# Patient Record
Sex: Female | Born: 1975 | Race: Black or African American | Hispanic: No | Marital: Single | Smoking: Current every day smoker
Health system: Southern US, Community
[De-identification: ages and names within clinical notes are randomized; demographics above are authoritative.]

## PROBLEM LIST (undated history)

## (undated) DIAGNOSIS — I10 Essential (primary) hypertension: Secondary | ICD-10-CM

## (undated) DIAGNOSIS — J329 Chronic sinusitis, unspecified: Secondary | ICD-10-CM

## (undated) DIAGNOSIS — J302 Other seasonal allergic rhinitis: Secondary | ICD-10-CM

## (undated) DIAGNOSIS — J40 Bronchitis, not specified as acute or chronic: Secondary | ICD-10-CM

## (undated) HISTORY — PX: TUBAL LIGATION: SHX77

---

## 1999-02-05 ENCOUNTER — Inpatient Hospital Stay (HOSPITAL_COMMUNITY): Admission: AD | Admit: 1999-02-05 | Discharge: 1999-02-05 | Payer: Self-pay | Admitting: Obstetrics & Gynecology

## 1999-07-03 ENCOUNTER — Inpatient Hospital Stay (HOSPITAL_COMMUNITY): Admission: AD | Admit: 1999-07-03 | Discharge: 1999-07-03 | Payer: Self-pay | Admitting: Obstetrics

## 1999-09-08 ENCOUNTER — Emergency Department (HOSPITAL_COMMUNITY): Admission: EM | Admit: 1999-09-08 | Discharge: 1999-09-08 | Payer: Self-pay | Admitting: Emergency Medicine

## 1999-09-24 ENCOUNTER — Inpatient Hospital Stay (HOSPITAL_COMMUNITY): Admission: AD | Admit: 1999-09-24 | Discharge: 1999-09-24 | Payer: Self-pay | Admitting: Obstetrics & Gynecology

## 1999-10-01 ENCOUNTER — Ambulatory Visit (HOSPITAL_COMMUNITY): Admission: RE | Admit: 1999-10-01 | Discharge: 1999-10-01 | Payer: Self-pay | Admitting: Family Medicine

## 1999-10-22 ENCOUNTER — Inpatient Hospital Stay (HOSPITAL_COMMUNITY): Admission: AD | Admit: 1999-10-22 | Discharge: 1999-10-22 | Payer: Self-pay | Admitting: Obstetrics & Gynecology

## 1999-11-02 ENCOUNTER — Inpatient Hospital Stay (HOSPITAL_COMMUNITY): Admission: AD | Admit: 1999-11-02 | Discharge: 1999-11-02 | Payer: Self-pay | Admitting: Obstetrics

## 1999-11-15 ENCOUNTER — Inpatient Hospital Stay (HOSPITAL_COMMUNITY): Admission: AD | Admit: 1999-11-15 | Discharge: 1999-11-15 | Payer: Self-pay | Admitting: Obstetrics & Gynecology

## 1999-11-16 ENCOUNTER — Inpatient Hospital Stay (HOSPITAL_COMMUNITY): Admission: AD | Admit: 1999-11-16 | Discharge: 1999-11-16 | Payer: Self-pay | Admitting: *Deleted

## 1999-11-22 ENCOUNTER — Observation Stay (HOSPITAL_COMMUNITY): Admission: AD | Admit: 1999-11-22 | Discharge: 1999-11-23 | Payer: Self-pay | Admitting: Obstetrics

## 1999-12-02 ENCOUNTER — Encounter: Admission: RE | Admit: 1999-12-02 | Discharge: 1999-12-02 | Payer: Self-pay | Admitting: Obstetrics & Gynecology

## 1999-12-16 ENCOUNTER — Ambulatory Visit (HOSPITAL_COMMUNITY): Admission: RE | Admit: 1999-12-16 | Discharge: 1999-12-16 | Payer: Self-pay | Admitting: Obstetrics & Gynecology

## 1999-12-16 ENCOUNTER — Encounter: Admission: RE | Admit: 1999-12-16 | Discharge: 1999-12-16 | Payer: Self-pay | Admitting: Obstetrics & Gynecology

## 1999-12-17 ENCOUNTER — Ambulatory Visit (HOSPITAL_COMMUNITY): Admission: RE | Admit: 1999-12-17 | Discharge: 1999-12-17 | Payer: Self-pay | Admitting: Obstetrics & Gynecology

## 1999-12-23 ENCOUNTER — Encounter: Admission: RE | Admit: 1999-12-23 | Discharge: 1999-12-23 | Payer: Self-pay | Admitting: Obstetrics & Gynecology

## 1999-12-28 ENCOUNTER — Inpatient Hospital Stay (HOSPITAL_COMMUNITY): Admission: AD | Admit: 1999-12-28 | Discharge: 1999-12-28 | Payer: Self-pay | Admitting: Obstetrics

## 2000-01-13 ENCOUNTER — Inpatient Hospital Stay (HOSPITAL_COMMUNITY): Admission: AD | Admit: 2000-01-13 | Discharge: 2000-01-13 | Payer: Self-pay | Admitting: Obstetrics

## 2000-01-20 ENCOUNTER — Encounter: Admission: RE | Admit: 2000-01-20 | Discharge: 2000-01-20 | Payer: Self-pay | Admitting: Obstetrics & Gynecology

## 2000-01-27 ENCOUNTER — Encounter (HOSPITAL_COMMUNITY): Admission: RE | Admit: 2000-01-27 | Discharge: 2000-02-09 | Payer: Self-pay | Admitting: Obstetrics & Gynecology

## 2000-01-27 ENCOUNTER — Encounter: Admission: RE | Admit: 2000-01-27 | Discharge: 2000-01-27 | Payer: Self-pay | Admitting: Obstetrics & Gynecology

## 2000-01-29 ENCOUNTER — Inpatient Hospital Stay (HOSPITAL_COMMUNITY): Admission: AD | Admit: 2000-01-29 | Discharge: 2000-01-29 | Payer: Self-pay | Admitting: Obstetrics & Gynecology

## 2000-02-01 ENCOUNTER — Encounter (HOSPITAL_COMMUNITY): Admission: AD | Admit: 2000-02-01 | Discharge: 2000-02-09 | Payer: Self-pay | Admitting: Obstetrics & Gynecology

## 2000-02-03 ENCOUNTER — Encounter: Admission: RE | Admit: 2000-02-03 | Discharge: 2000-02-03 | Payer: Self-pay | Admitting: Obstetrics & Gynecology

## 2000-02-08 ENCOUNTER — Inpatient Hospital Stay (HOSPITAL_COMMUNITY): Admission: AD | Admit: 2000-02-08 | Discharge: 2000-02-10 | Payer: Self-pay | Admitting: Obstetrics

## 2000-09-03 ENCOUNTER — Emergency Department (HOSPITAL_COMMUNITY): Admission: EM | Admit: 2000-09-03 | Discharge: 2000-09-03 | Payer: Self-pay | Admitting: Emergency Medicine

## 2001-02-12 ENCOUNTER — Emergency Department (HOSPITAL_COMMUNITY): Admission: EM | Admit: 2001-02-12 | Discharge: 2001-02-12 | Payer: Self-pay | Admitting: Emergency Medicine

## 2001-02-12 ENCOUNTER — Encounter: Payer: Self-pay | Admitting: Emergency Medicine

## 2001-05-07 ENCOUNTER — Emergency Department (HOSPITAL_COMMUNITY): Admission: EM | Admit: 2001-05-07 | Discharge: 2001-05-07 | Payer: Self-pay | Admitting: Emergency Medicine

## 2001-05-07 ENCOUNTER — Encounter: Payer: Self-pay | Admitting: Emergency Medicine

## 2001-11-16 ENCOUNTER — Encounter: Admission: RE | Admit: 2001-11-16 | Discharge: 2001-11-16 | Payer: Self-pay | Admitting: Specialist

## 2001-11-16 ENCOUNTER — Encounter: Payer: Self-pay | Admitting: Specialist

## 2001-12-19 ENCOUNTER — Emergency Department (HOSPITAL_COMMUNITY): Admission: EM | Admit: 2001-12-19 | Discharge: 2001-12-19 | Payer: Self-pay | Admitting: Emergency Medicine

## 2001-12-19 ENCOUNTER — Encounter: Payer: Self-pay | Admitting: Emergency Medicine

## 2002-03-06 ENCOUNTER — Inpatient Hospital Stay: Admission: AD | Admit: 2002-03-06 | Discharge: 2002-03-06 | Payer: Self-pay | Admitting: *Deleted

## 2002-04-05 ENCOUNTER — Encounter: Admission: RE | Admit: 2002-04-05 | Discharge: 2002-04-05 | Payer: Self-pay | Admitting: *Deleted

## 2002-04-12 ENCOUNTER — Ambulatory Visit (HOSPITAL_COMMUNITY): Admission: RE | Admit: 2002-04-12 | Discharge: 2002-04-12 | Payer: Self-pay

## 2002-05-03 ENCOUNTER — Encounter: Admission: RE | Admit: 2002-05-03 | Discharge: 2002-05-03 | Payer: Self-pay | Admitting: *Deleted

## 2002-05-12 ENCOUNTER — Inpatient Hospital Stay (HOSPITAL_COMMUNITY): Admission: AD | Admit: 2002-05-12 | Discharge: 2002-05-12 | Payer: Self-pay | Admitting: *Deleted

## 2002-07-02 ENCOUNTER — Inpatient Hospital Stay (HOSPITAL_COMMUNITY): Admission: AD | Admit: 2002-07-02 | Discharge: 2002-07-02 | Payer: Self-pay | Admitting: *Deleted

## 2002-07-11 ENCOUNTER — Encounter: Admission: RE | Admit: 2002-07-11 | Discharge: 2002-07-11 | Payer: Self-pay | Admitting: *Deleted

## 2002-07-31 ENCOUNTER — Ambulatory Visit (HOSPITAL_COMMUNITY): Admission: RE | Admit: 2002-07-31 | Discharge: 2002-07-31 | Payer: Self-pay | Admitting: *Deleted

## 2002-08-01 ENCOUNTER — Encounter: Admission: RE | Admit: 2002-08-01 | Discharge: 2002-08-01 | Payer: Self-pay | Admitting: *Deleted

## 2002-08-08 ENCOUNTER — Encounter: Admission: RE | Admit: 2002-08-08 | Discharge: 2002-08-08 | Payer: Self-pay | Admitting: *Deleted

## 2002-08-22 ENCOUNTER — Encounter: Payer: Self-pay | Admitting: *Deleted

## 2002-08-22 ENCOUNTER — Inpatient Hospital Stay (HOSPITAL_COMMUNITY): Admission: AD | Admit: 2002-08-22 | Discharge: 2002-08-25 | Payer: Self-pay | Admitting: Family Medicine

## 2002-08-24 ENCOUNTER — Encounter: Payer: Self-pay | Admitting: *Deleted

## 2002-09-12 ENCOUNTER — Inpatient Hospital Stay (HOSPITAL_COMMUNITY): Admission: AD | Admit: 2002-09-12 | Discharge: 2002-09-12 | Payer: Self-pay | Admitting: Obstetrics and Gynecology

## 2002-09-24 ENCOUNTER — Inpatient Hospital Stay (HOSPITAL_COMMUNITY): Admission: AD | Admit: 2002-09-24 | Discharge: 2002-09-24 | Payer: Self-pay | Admitting: *Deleted

## 2002-09-30 ENCOUNTER — Inpatient Hospital Stay (HOSPITAL_COMMUNITY): Admission: AD | Admit: 2002-09-30 | Discharge: 2002-09-30 | Payer: Self-pay | Admitting: *Deleted

## 2002-10-12 ENCOUNTER — Encounter (INDEPENDENT_AMBULATORY_CARE_PROVIDER_SITE_OTHER): Payer: Self-pay | Admitting: *Deleted

## 2002-10-12 ENCOUNTER — Inpatient Hospital Stay (HOSPITAL_COMMUNITY): Admission: AD | Admit: 2002-10-12 | Discharge: 2002-10-14 | Payer: Self-pay | Admitting: Obstetrics and Gynecology

## 2004-04-18 ENCOUNTER — Emergency Department (HOSPITAL_COMMUNITY): Admission: EM | Admit: 2004-04-18 | Discharge: 2004-04-18 | Payer: Self-pay | Admitting: Emergency Medicine

## 2004-05-03 ENCOUNTER — Ambulatory Visit (HOSPITAL_COMMUNITY): Admission: RE | Admit: 2004-05-03 | Discharge: 2004-05-03 | Payer: Self-pay | Admitting: Orthopaedic Surgery

## 2005-03-12 ENCOUNTER — Emergency Department (HOSPITAL_COMMUNITY): Admission: EM | Admit: 2005-03-12 | Discharge: 2005-03-12 | Payer: Self-pay | Admitting: Emergency Medicine

## 2006-07-14 ENCOUNTER — Emergency Department (HOSPITAL_COMMUNITY): Admission: EM | Admit: 2006-07-14 | Discharge: 2006-07-14 | Payer: Self-pay | Admitting: Emergency Medicine

## 2007-02-14 ENCOUNTER — Emergency Department (HOSPITAL_COMMUNITY): Admission: EM | Admit: 2007-02-14 | Discharge: 2007-02-15 | Payer: Self-pay | Admitting: *Deleted

## 2007-04-22 ENCOUNTER — Emergency Department (HOSPITAL_COMMUNITY): Admission: EM | Admit: 2007-04-22 | Discharge: 2007-04-22 | Payer: Self-pay | Admitting: Emergency Medicine

## 2008-02-19 ENCOUNTER — Emergency Department (HOSPITAL_COMMUNITY): Admission: EM | Admit: 2008-02-19 | Discharge: 2008-02-19 | Payer: Self-pay | Admitting: Emergency Medicine

## 2009-09-16 ENCOUNTER — Emergency Department (HOSPITAL_COMMUNITY): Admission: EM | Admit: 2009-09-16 | Discharge: 2009-09-16 | Payer: Self-pay | Admitting: Family Medicine

## 2010-11-01 ENCOUNTER — Encounter: Payer: Self-pay | Admitting: Orthopaedic Surgery

## 2011-02-26 NOTE — Op Note (Signed)
NAME:  Mary Decker, Mary Decker                          ACCOUNT NO.:  [id number]   MEDICAL RECORD NO.:  [id number]                   PATIENT TYPE:  INP   LOCATION:  9137                                 FACILITY:  [facility]   PHYSICIAN:  Tanya S. Shawnie Pons, M.D.                DATE OF BIRTH:  1976/05/19   DATE OF PROCEDURE:  10/12/2001  DATE OF DISCHARGE:                                 OPERATIVE REPORT   PREOPERATIVE DIAGNOSIS:  Multiparity with undesired fertility.   POSTOPERATIVE DIAGNOSIS:  Multiparity with undesired fertility.   PROCEDURE:  Postpartum BTL.   SURGEON:  Shelbie Proctor. Shawnie Pons, M.D.   ANESTHESIA:  Epidural.   ESTIMATED BLOOD LOSS:  Less than 25 cc.   SPECIMENS:  Tubes to pathology.   COMPLICATIONS:  None.   REASON FOR PROCEDURE:  Briefly, the patient is an 35 year old gravida 5 para  4 now who is postpartum day #0 from a spontaneous vaginal delivery who  desires permanent sterility.   DESCRIPTION OF PROCEDURE:  The patient is taken to the operating room where  epidural anesthesia is administered.  She was also given some IV sedation  when her anesthesia was shown to be adequate.  She was prepped and draped in  the usual sterile fashion.  Marcaine 0.25% plain 5 cc was then injected  infraumbilically.  The skin was then grasped with 2 Allis clamps and I then  made a 2 cm skin incision.  This incision was carried down to the underlying  fascia and the peritoneum which was entered sharply.  An [employer] retractor  was then inserted into the peritoneum.  The patient was placed in  Trendelenburg.  The left tube was identified and grasped with a Babcock  clamp, followed to its fimbriated end.  A 1 cm nuchal tube was brought up to  approximately 2 cm from the cornea of the uterus and 0 plain tie ligatures  placed about this tube x2.  The tubes were removed with Metzenbaum scissors  and tube was checked for hemostasis, and hemostasis was found to be  adequate.  The tube was returned to  the abdominal cavity.  Similarly, the  right tube was identified and brought to the incision with a Babcock clamp,  followed to its fimbriated end; 2 cm from the cornea a 1 cm nuchal tube was  then ligated with two 0 plain ligatures.  The tube was then excised and sent  to pathology.  When hemostasis was found to be adequate the tube was  returned to the peritoneal cavity.  The edges of the fascia were then  grasped with an Allis cramp and the fascia closed with a #1 Vicryl suture in  a running fashion.  The skin was then closed with a 4-0 Vicryl in a running  subcuticular fashion.  The patient's abdomen was then cleaned and sterile  dressing was applied.  All instrument, needle, and  lap counts were correct  x2.  The patient was awakened and taken to recovery room in stable  condition.                                               Shelbie Proctor. Shawnie Pons, M.D.   TSP/MEDQ  D:  10/12/2002  T:  10/13/2002  Job:  [id number]

## 2011-02-26 NOTE — Discharge Summary (Signed)
NAME:  Mary Decker, Mary Decker                          ACCOUNT NO.:  [id number]   MEDICAL RECORD NO.:  [id number]                   PATIENT TYPE:  INP   LOCATION:  9137                                 [facility]ACILITY:  [facility]   PHYSICIAN:  Phil D. Okey Dupre, M.D.                  DATE OF BIRTH:  1976-07-18   DATE OF ADMISSION:  10/12/2002  DATE OF DISCHARGE:  10/14/2002                                 DISCHARGE SUMMARY   ADMISSION DIAGNOSES:  1. Term intrauterine pregnancy in active labor.  2. Multiparity.  3. Chronic hypertension.   DISCHARGE DIAGNOSES:  1. Spontaneous vaginal delivery of viable female, Apgars 9 at one minute and     9 at five minutes.  2. Bilateral tubal ligation.  3. Chronic hypertension.   HISTORY OF PRESENT ILLNESS:  This is a 35 year old gravida 5, para 3-0-1-3,  who presented at 39-4/7 weeks well-dated, in active labor.  She was brought  in by EMS reporting having had contractions with intact membranes, some  vaginal show, and good fetal activity.  Her ROS was negative.  Her prenatal  course was at [facility] with onset of care at 11 weeks.  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  She was on Aldomet low dose 150 t.i.d. for her chronic  hypertension.  She is also on prenatal vitamin one a day. She had no  allergies.   PAST OBSTETRICAL HISTORY:  She had uncomplicated term vaginal deliveries x3.  GYN history negative STI's or abnormal Paps.   PAST MEDICAL HISTORY:  Hypertension.  Surgeries; none.   FAMILY HISTORY:  Noncontributory.   SOCIAL HISTORY:  She was a smoker, smoking about four to five cigarettes a  day.   PRENATAL LABORATORY DATA:  O positive, antibody screen negative, hemoglobin  12.9, rubella immune, hepatitis negative, syphilis negative, HIV negative,  GC negative, Chlamydia negative, GBS negative. One-hour GTT 76.   PHYSICAL EXAMINATION:  VITAL SIGNS: Afebrile, vital signs  significant for  blood pressure 147/112.  GENERAL:  She was in no distress.  HEART:  Regular rate and rhythm without murmur.  LUNGS:  Clear to auscultation.  ABDOMEN:  Term size gravid.  EXTREMITIES: Without edema.  NEUROLOGY:  Nonfocal.  PELVIC: Her digital cervical examination on admission was 5 cm.  Fetal heart  rate was 140, reactive with average variability without decelerations.  Her  contractions were approximately 3 to 5 minutes apart and moderately strong.   HOSPITAL COURSE:  The decision was made to admit for active labor at term  with routine expectant management.  She did receive an epidural and  progressed well in active labor. She had meconium stained fluid on  artificial rupture of membranes.  She progressed to delivery of a viable  female spontaneously. She had DeLee suction with spontaneous cry. NICU team  was present with no lacerations. Apgars were 9 at one minute and 9at five  minutes.  Subsequently, she went to the operating room for her bilateral  tubal ligation by Dr. Shawnie Pons. Pathology revealed segments of right and left  fallopian tube.  Postoperatively, she had some mild wound tenderness.  She  remained afebrile, blood pressure 120/98.  Percocet was controlling her pain  and on postpartum day #2 and postoperative day #2 from her tubal she was  doing well, bottle feeding, ambulatory without orthostatic symptoms.  Lochia  was tapering.  Her blood pressure was 132/90 on discharge day. Her PIH labs  had been well within normal limits. Her hemoglobin was 8.9, hematocrit 24.5.  Examination was normal with a well-contracted fundus.  The need to have  regular medical care and follow-up on her blood pressures was discussed with  the patient. She was to have a home health postpartum visit in two days and  a six-[facility] checkup at [facility].  She was planning to follow up at  [facility] or at [facility] in about three months to assess  the need for an  antihypertensive as she was not sent home on one.   DISCHARGE MEDICATIONS:  1. Iron one p.o. b.i.d.  2. Ibuprofen 600 mg one p.o. q.6h.  3. Percocet 325 mg one p.o. q.6h. p.r.n. pain.   CONDITION ON DISCHARGE:  Good.     Deirdre Christy Gentles, C.N.M.                       Phil D. Okey Dupre, M.D.    DP/MEDQ  D:  01/21/2003  T:  01/21/2003  Job:  [id number]

## 2011-02-26 NOTE — Discharge Summary (Signed)
NAME:  Mary Decker, Mary Decker                          ACCOUNT NO.:  [id number]   MEDICAL RECORD NO.:  [id number]                   PATIENT TYPE:  INP   LOCATION:  9160                                 [facility]   PHYSICIAN:  Tanya S. Shawnie Pons, M.D.                DATE OF BIRTH:  Apr 20, 1976   DATE OF ADMISSION:  08/22/2002  DATE OF DISCHARGE:  08/25/2002                                 DISCHARGE SUMMARY   FINAL DIAGNOSES:  1. Intrauterine pregnancy at 32-5/7 weeks.  2. Question of prolonged premature rupture of membranes ruled out.  3. Chronic hypertension.   REASON FOR ADMISSION:  Briefly, please see full H&P on the chart, the  patient is a 35 year old gravida 5, para 3-0-1-3, who came in at 32-1/2  weeks on the day of admission with back pain.  She then proceeded to  rupture in the MAU, which according to the nurses was fern and nitrazine-  positive.  She alsos had a foul odor which appeared to be fern-positive.  Given her early gestation, the patient was given a course of betamethasone,  started on antibiotics, and managed on continuous fetal monitoring.   HOSPITAL COURSE:  The patient had then continuous fetal monitoring for the  next two days.  She completed a course of betamethasone and was continued on  Unasyn.  She reported increasing contractions and a sterile speculum exam  was done and the rupture could not be confirmed, at which point there was a  question of whether this was truly a rupture.  On admission the patient had  an ultrasound which showed an AFI of 11.0 and an estimated fetal weight of  2005 g, and the baby was found to be vertex.  On admission her blood  pressure was markedly elevated at 150s/100s, and this came down to the  130s/60s-70s over the next several days.  She had a complete set of PIH  labs, which were within normal limits, including a 24-hour urine that had  absolutely no protein.  On hospital day #2 a repeat of her AFI was done and  revealed her AFI  to be 16.  At that point the ruptured membranes were felt  to probably not be true, so the patient was ambulated to see if she leaked.  She did leak and this was nitrazine-positive but fern-negative.  Sterile  speculum was repeated again.  It was negative pooling, negative ferning,  negative nitrazine, and then finally her urine was nitrazined and was found  to be positive, and it was felt that the patient was leaking urine and not  amniotic fluid.  So given all this and her normal labs, it was felt the  patient was ready for discharge.   LABORATORY DATA:  Her initial hemoglobin was 10, her platelet count was 274,  her creatinine was 0.6, her uric acid was 2.8, her LFTs were within normal  limit.  She had a urine drug screen that was also within normal limits.  She  had a 24-hour urine which showed less than 3 mg of protein with a good  creatinine clearance that was high, as was appropriate.  She also had a  urine culture, which was negative.  RPR was nonreactive.  Group B strep,  which was still pending.  GC and Chlamydia, which were both negative.   DISPOSITION:  She was discharged home with labor precautions and to return  with increasing pain, contractions, bleeding, or general concern over  leakage of fluid.    FOLLOW-UP:  She is to follow up in high-risk clinic next week.   DISCHARGE INSTRUCTIONS:  She has no activity or dietary restrictions.                                               Shelbie Proctor. Shawnie Pons, M.D.    TSP/MEDQ  D:  08/25/2002  T:  08/25/2002  Job:  [id number]

## 2011-04-28 ENCOUNTER — Inpatient Hospital Stay (INDEPENDENT_AMBULATORY_CARE_PROVIDER_SITE_OTHER)
Admission: RE | Admit: 2011-04-28 | Discharge: 2011-04-28 | Disposition: A | Discharge status: Home / Self Care | Payer: Medicaid Other | Source: Ambulatory Visit | Attending: Emergency Medicine | Admitting: Emergency Medicine

## 2011-04-28 DIAGNOSIS — K089 Disorder of teeth and supporting structures, unspecified: Secondary | ICD-10-CM

## 2011-04-28 DIAGNOSIS — I10 Essential (primary) hypertension: Secondary | ICD-10-CM

## 2011-04-28 LAB — POCT I-STAT, CHEM 8
BUN: 6 mg/dL (ref 6–23)
Hemoglobin: 16.3 g/dL — ABNORMAL HIGH (ref 12.0–15.0)
Sodium: 140 mEq/L (ref 135–145)

## 2011-07-27 LAB — BASIC METABOLIC PANEL
CO2: 21
GFR calc Af Amer: >60
Glucose, Bld: 96
Potassium: 3.5

## 2011-07-27 LAB — SALICYLATE LEVEL: Salicylate Lvl: <4

## 2011-07-27 LAB — RAPID URINE DRUG SCREEN, HOSP PERFORMED
Amphetamines: NOT DETECTED
Barbiturates: NOT DETECTED
Benzodiazepines: NOT DETECTED
Cocaine: NOT DETECTED
Opiates: NOT DETECTED
Tetrahydrocannabinol: POSITIVE — AB

## 2011-07-27 LAB — HEPATIC FUNCTION PANEL
ALT: 13
Alkaline Phosphatase: 54
Total Bilirubin: 0.2 — ABNORMAL LOW
Total Protein: 7.3

## 2011-07-27 LAB — DIFFERENTIAL
Basophils Absolute: 0
Basophils Relative: 0
Lymphs Abs: 1.6
Monocytes Relative: 8
Neutro Abs: 4.7

## 2011-07-27 LAB — CBC
MCV: 92.2
RDW: 13.6

## 2015-05-25 ENCOUNTER — Emergency Department (HOSPITAL_COMMUNITY)
Admission: EM | Admit: 2015-05-25 | Discharge: 2015-05-25 | Disposition: A | Discharge status: Home / Self Care | Payer: Medicaid Other | Attending: Emergency Medicine | Admitting: Emergency Medicine

## 2015-05-25 ENCOUNTER — Encounter (HOSPITAL_COMMUNITY): Payer: Self-pay | Admitting: *Deleted

## 2015-05-25 DIAGNOSIS — I10 Essential (primary) hypertension: Secondary | ICD-10-CM | POA: Insufficient documentation

## 2015-05-25 DIAGNOSIS — L03317 Cellulitis of buttock: Secondary | ICD-10-CM | POA: Insufficient documentation

## 2015-05-25 DIAGNOSIS — Z720 Tobacco use: Secondary | ICD-10-CM | POA: Insufficient documentation

## 2015-05-25 DIAGNOSIS — L0231 Cutaneous abscess of buttock: Secondary | ICD-10-CM

## 2015-05-25 HISTORY — DX: Essential (primary) hypertension: I10

## 2015-05-25 MED ORDER — CLINDAMYCIN HCL 150 MG PO CAPS: 300.0000 mg | ORAL_CAPSULE | Freq: Three times a day (TID) | ORAL | Status: DC

## 2015-05-25 MED ORDER — CLINDAMYCIN HCL 300 MG PO CAPS
300.0000 mg | ORAL_CAPSULE | Freq: Once | ORAL | Status: AC
  Administered 2015-05-25: 300 mg via ORAL
  Filled 2015-05-25: qty 2
  Filled 2015-05-25: qty 1

## 2015-05-25 MED ORDER — OXYCODONE-ACETAMINOPHEN 5-325 MG PO TABS: 1.0000 | ORAL_TABLET | ORAL | Status: DC | PRN

## 2015-05-25 MED ORDER — OXYCODONE-ACETAMINOPHEN 5-325 MG PO TABS
2.0000 | ORAL_TABLET | Freq: Once | ORAL | Status: AC
  Administered 2015-05-25: 2 via ORAL
  Filled 2015-05-25: qty 2

## 2015-05-25 MED ORDER — LIDOCAINE HCL (PF) 1 % IJ SOLN
10.0000 mL | Freq: Once | INTRAMUSCULAR | Status: AC
  Administered 2015-05-25: 10 mL via INTRADERMAL
  Filled 2015-05-25: qty 10

## 2015-05-25 NOTE — ED Provider Notes (Signed)
CSN: [id number]     Arrival date & time 05/25/15  1502 History   First MD Initiated Contact with Patient 05/25/15 1644     Chief Complaint  Patient presents with  . Recurrent Skin Infections     (Consider location/radiation/quality/duration/timing/severity/associated sxs/prior Treatment) The history is provided by the patient and medical records.   This is a 39 y.o. F with hx of HTN, presenting to the ED for abscess of right buttock.  Patient states this initially this started as a small pimple of her right outer buttock which she scratched causing it to form an abscess. Patient states she applied warm compresses and popped the abscess with a small amount of purulent drainage.  States since this time she has had increasing redness around the abscess.  She endorses subjective fever, no formal temp check.  No chills, sweats. No hx of DM, HIV, MRSA. No hx of recurrent abscesses or complications with infections. Patient with noted tachycardia on arrival, vital signs otherwise stable.  Past Medical History  Diagnosis Date  . Hypertension    History reviewed. No pertinent past surgical history. No family history on file. Social History  Substance Use Topics  . Smoking status: Current Every Day Smoker -- 0.50 packs/day    Types: Cigarettes  . Smokeless tobacco: None  . Alcohol Use: Yes   OB History    No data available     Review of Systems  Skin: Positive for color change.       abscess  All other systems reviewed and are negative.     Allergies  Review of patient's allergies indicates no known allergies.  Home Medications   Prior to Admission medications   Medication Sig Start Date End Date Taking? Authorizing Provider  acetaminophen (TYLENOL) 500 MG tablet Take 1,000 mg by mouth every 6 (six) hours as needed for moderate pain.   Yes Historical Provider, MD  Benzocaine (BOIL PAIN RELIEF) 20 % OINT Apply 1 application topically 2 (two) times daily as needed (for boil).   Yes  Historical Provider, MD   BP 156/86 mmHg  Pulse 129  Temp(Src) 98.5 F (36.9 C) (Oral)  Resp 20  Ht 5\' 7"  (1.702 m)  Wt 163 lb 4.8 oz (74.072 kg)  BMI 25.57 kg/m2  SpO2 99%  LMP 04/24/2015   Physical Exam  Constitutional: She is oriented to person, place, and time. She appears well-developed and well-nourished.  HENT:  Head: Normocephalic and atraumatic.  Mouth/Throat: Oropharynx is clear and moist.  Eyes: Conjunctivae and EOM are normal. Pupils are equal, round, and reactive to light.  Neck: Normal range of motion.  Cardiovascular: Normal rate, regular rhythm and normal heart sounds.   Pulmonary/Chest: Effort normal and breath sounds normal. No respiratory distress. She has no wheezes.  Abdominal: Soft. Bowel sounds are normal.  Genitourinary:  Moderate sized abscess of right outer buttock with approximately 4cm of surrounding cellulitis on all sides; there is central head noted with some fluctuance; no active drainage; no streaking or lymphangitis of back or perineum; no skin necrosis or breakdown  Musculoskeletal: Normal range of motion.  Neurological: She is alert and oriented to person, place, and time.  Skin: Skin is warm and dry.  Psychiatric: She has a normal mood and affect.  Nursing note and vitals reviewed.   ED Course  Procedures (including critical care time)  INCISION AND DRAINAGE Performed by: Garlon Hatchet Consent: Verbal consent obtained. Risks and benefits: risks, benefits and alternatives were discussed Type: abscess  Body area: right outer buttock  Anesthesia: local infiltration  Incision was made with a scalpel.  Local anesthetic: lidocaine 1% without epinephrine  Anesthetic total: 5 ml  Complexity: complex Blunt dissection to break up loculations  Drainage: purulent  Drainage amount: large  Packing material: none  Patient tolerance: Patient tolerated the procedure well with no immediate complications.    Labs Review Labs  Reviewed - No data to display  Imaging Review No results found.   EKG Interpretation None      MDM   Final diagnoses:  Abscess of buttock, right  Cellulitis of buttock, right   39 year old female with abscess of right buttock. She does have surrounding cellulitis without extension to left buttock, perineum, back, or legs. No areas of skin necrosis or breakdown. There is central fluctuance noted. No lymphangitis or streaking.  Patient is afebrile, some tachycardia noted which may be due to pain. I&D performed as above, patient tolerated well. Her tachycardia has resolved after pain medications. She remains afebrile and nontoxic in appearance. I feel it is reasonable to attempt outpatient management as patient has no underlying comorbidities and no history of complicated abscesses. She will be started on clindamycin, first dose given here in ED. She was instructed to monitor abscess closely over the next 48 hours including drainage, increasing erythema/cellulitis, high fevers, or chills. She will return here for any signs of worsening infection.  Discussed plan with patient, he/she acknowledged understanding and agreed with plan of care.  Return precautions given for new or worsening symptoms.   Garlon Hatchet, PA-C 05/25/15 1925  Rolland Porter, MD 05/30/15 [phone]

## 2015-05-25 NOTE — ED Notes (Signed)
Pt reports rt buttocks abscess that has been ongoing for 1 week. Pt states that she has tried home remedies and it has drained some but not gone away.

## 2015-05-25 NOTE — Discharge Instructions (Signed)
Take the prescribed medication as directed.  May continue warm compresses at home health aide continued drainage from abscess. Return to the ED for new or worsening symptoms-- increased redness, drainage, streaking of buttock or leg, high fever, chills, sweats, etc.

## 2015-05-27 ENCOUNTER — Telehealth: Payer: Self-pay | Admitting: *Deleted

## 2015-05-27 NOTE — Telephone Encounter (Signed)
Pt calling stating Rx prescribed is too expensive and she can not afford them.  NCM searched GoodRx.com for an affordable coupon.  NCM text coupon to pt phone and stayed online until it was received.  Pt very appreciative.   

## 2016-10-18 ENCOUNTER — Emergency Department (HOSPITAL_COMMUNITY)
Admission: EM | Admit: 2016-10-18 | Discharge: 2016-10-18 | Disposition: A | Discharge status: Home / Self Care | Payer: Medicaid Other | Attending: Emergency Medicine | Admitting: Emergency Medicine

## 2016-10-18 ENCOUNTER — Emergency Department (HOSPITAL_COMMUNITY): Payer: Medicaid Other

## 2016-10-18 ENCOUNTER — Encounter (HOSPITAL_COMMUNITY): Payer: Self-pay

## 2016-10-18 DIAGNOSIS — Z5321 Procedure and treatment not carried out due to patient leaving prior to being seen by health care provider: Secondary | ICD-10-CM | POA: Diagnosis not present

## 2016-10-18 DIAGNOSIS — R05 Cough: Secondary | ICD-10-CM | POA: Diagnosis present

## 2016-10-18 NOTE — ED Triage Notes (Signed)
Per Pt, Pt is coming from home with complaints of cough and congestion x2 weeks. Pt reports having some sputum that she is unable to get up. Pt complains of left knee pain from an old injury that has worsened. Hx of HTN.

## 2016-10-18 NOTE — ED Notes (Signed)
Called pt to recheck vitals no answer 

## 2016-10-19 ENCOUNTER — Encounter (HOSPITAL_COMMUNITY): Payer: Self-pay | Admitting: *Deleted

## 2016-10-19 ENCOUNTER — Ambulatory Visit (HOSPITAL_COMMUNITY)
Admission: EM | Admit: 2016-10-19 | Discharge: 2016-10-19 | Disposition: A | Discharge status: Home / Self Care | Payer: Medicaid Other | Attending: Family Medicine | Admitting: Family Medicine

## 2016-10-19 DIAGNOSIS — J40 Bronchitis, not specified as acute or chronic: Secondary | ICD-10-CM | POA: Diagnosis not present

## 2016-10-19 MED ORDER — AZITHROMYCIN 250 MG PO TABS: 250.0000 mg | ORAL_TABLET | Freq: Every day | ORAL | 0 refills | Status: DC

## 2016-10-19 MED ORDER — ALBUTEROL SULFATE HFA 108 (90 BASE) MCG/ACT IN AERS: 1.0000 | INHALATION_SPRAY | Freq: Four times a day (QID) | RESPIRATORY_TRACT | 0 refills | Status: DC | PRN

## 2016-10-19 MED ORDER — PREDNISONE 10 MG (21) PO TBPK: ORAL_TABLET | ORAL | 0 refills | Status: DC

## 2016-10-19 MED ORDER — BENZONATATE 100 MG PO CAPS: 100.0000 mg | ORAL_CAPSULE | Freq: Three times a day (TID) | ORAL | 0 refills | Status: DC

## 2016-10-19 NOTE — ED Provider Notes (Signed)
CSN: [id number]     Arrival date & time 10/19/16  1006 History   None    Chief Complaint  Patient presents with  . URI   (Consider location/radiation/quality/duration/timing/severity/associated sxs/prior Treatment) 41 year old female presents to clinic with chief complaint of cough and congestion for 4-5 days. Patient is a smoker, denies history of asthma or chronic lung disease. Her cough has been productive with white sputum, worse at night. She denies fever, body aches, nausea, or vomiting.  She also complains of pain to the left knee for several years. States she was told she would need surgical repair of worn cartilage but has not sought out the surgery.  She was seen last night in the ER and had chest and knee xrays taken but left prior to treatment due to wait.      Past Medical History:  Diagnosis Date  . Hypertension    Past Surgical History:  Procedure Laterality Date  . TUBAL LIGATION     History reviewed. No pertinent family history. Social History  Substance Use Topics  . Smoking status: Current Every Day Smoker    Packs/day: 0.50    Types: Cigarettes  . Smokeless tobacco: Never Used  . Alcohol use Yes   OB History    No data available     Review of Systems  Constitutional: Positive for appetite change, chills and fever. Negative for fatigue.  HENT: Positive for congestion, rhinorrhea and sore throat. Negative for sinus pain and sinus pressure.   Respiratory: Positive for cough, shortness of breath and wheezing. Negative for chest tightness.   Cardiovascular: Negative.   Gastrointestinal: Negative.   Musculoskeletal: Negative.   Skin: Negative.     Allergies  Tomato  Home Medications   Prior to Admission medications   Medication Sig Start Date End Date Taking? Authorizing Provider  acetaminophen (TYLENOL) 500 MG tablet Take 1,000 mg by mouth every 6 (six) hours as needed for moderate pain.    Historical Provider, MD  albuterol (PROVENTIL HFA;VENTOLIN  HFA) 108 (90 Base) MCG/ACT inhaler Inhale 1-2 puffs into the lungs every 6 (six) hours as needed for wheezing or shortness of breath. 10/19/16   Dorena BodoLawrence Rainna Nearhood, NP  azithromycin (ZITHROMAX) 250 MG tablet Take 1 tablet (250 mg total) by mouth daily. Take first 2 tablets together, then 1 every day until finished. 10/19/16   Dorena BodoLawrence Jacayla Nordell, NP  Benzocaine (BOIL PAIN RELIEF) 20 % OINT Apply 1 application topically 2 (two) times daily as needed (for boil).    Historical Provider, MD  benzonatate (TESSALON) 100 MG capsule Take 1 capsule (100 mg total) by mouth every 8 (eight) hours. 10/19/16   Dorena BodoLawrence Fleda Pagel, NP  clindamycin (CLEOCIN) 150 MG capsule Take 2 capsules (300 mg total) by mouth 3 (three) times daily. May dispense as 150mg  capsules 05/25/15   Garlon HatchetLisa M Sanders, PA-C  oxyCODONE-acetaminophen (PERCOCET/ROXICET) 5-325 MG per tablet Take 1 tablet by mouth every 4 (four) hours as needed. 05/25/15   Garlon HatchetLisa M Sanders, PA-C  predniSONE (STERAPRED UNI-PAK 21 TAB) 10 MG (21) TBPK tablet Take 6 tablets today then decrease by 1 each day till finished 10/19/16   Dorena BodoLawrence Arlo Butt, NP   Meds Ordered and Administered this Visit  Medications - No data to display  BP 120/60 (BP Location: Right Arm)   Pulse 110   Temp 98.5 F (36.9 C)   Resp 24   LMP 10/19/2016   SpO2 95%  No data found.   Physical Exam  Constitutional: She is oriented  to person, place, and time. She appears well-developed and well-nourished. No distress.  HENT:  Head: Normocephalic.  Right Ear: Tympanic membrane and external ear normal.  Left Ear: Tympanic membrane and external ear normal.  Eyes: Pupils are equal, round, and reactive to light.  Neck: No JVD present.  Cardiovascular: Normal rate and regular rhythm.   Pulmonary/Chest: Effort normal. She has wheezes (Left upper fields, otherwise clear).  Abdominal: Soft. Bowel sounds are normal.  Lymphadenopathy:    She has no cervical adenopathy.  Neurological: She is alert and oriented to  person, place, and time.  Skin: Skin is warm and dry. Capillary refill takes less than 2 seconds. She is not diaphoretic.  Psychiatric: She has a normal mood and affect.  Nursing note and vitals reviewed.   Urgent Care Course   Clinical Course     Procedures (including critical care time)  Labs Review Labs Reviewed - No data to display  Imaging Review Dg Chest 2 View  Result Date: 10/18/2016 CLINICAL DATA:  Cough, chest congestion, fever, and nausea and vomiting for the past week. Current smoker. EXAM: CHEST  2 VIEW COMPARISON:  Report of a chest x-ray of December 19, 2001. FINDINGS: The lungs are mildly hyperinflated with hemidiaphragm flattening. There is no focal infiltrate. There is no pleural effusion or pneumothorax. The heart and pulmonary vascularity are normal. The mediastinum is normal in width. The bony thorax exhibits no acute abnormality. IMPRESSION: There is no pneumonia nor other acute cardiopulmonary abnormality. Mild hyperinflation may reflect the patient's smoking history. Electronically Signed   By: David  [place] M.D.   On: 10/18/2016 16:28   Dg Knee Complete 4 Views Left  Result Date: 10/18/2016 CLINICAL DATA:  Left knee pain. EXAM: LEFT KNEE - COMPLETE 4+ VIEW COMPARISON:  Radiographs of Feb 14, 2007. FINDINGS: There is continued presence of chronic avulsion of anterior tibial spine consistent with old fracture as described on prior exam. No acute fracture, dislocation or joint effusion is noted. Vascular calcifications are noted. Mild narrowing of medial joint space is noted. IMPRESSION: Old fracture as described above. No acute abnormality seen in the left knee. Mild degenerative joint disease is noted medially. Electronically Signed   By: Lupita Raider, M.D.   On: 10/18/2016 16:30     Visual Acuity Review  Right Eye Distance:   Left Eye Distance:   Bilateral Distance:    Right Eye Near:   Left Eye Near:    Bilateral Near:         MDM   1. Bronchitis     Patient was given prescriptions for Azithromycin, Tessalon, prednisone, and an albuterol inhaler for her symptoms. She may also take tylenol and mucinex OTC as needed for her symptoms as well. Should they worsen follow up with her PCP in one week or return to clinic.     Dorena Bodo, NP 10/19/16 1112

## 2016-10-19 NOTE — Discharge Instructions (Signed)
Your chest xrays from last night have been reviewed and you do not have pneumonia. You most likely have bronchitis. Be sure to finish your antibiotics. You have been given a prednisone taper and a medicine called tessalon for cough. You have also been given an albuterol inhaler as well. You may continue to take Mucinex with water and Tylenol for symptoms. Should symptoms fail to resolve follow up with your PCP or return to clinic.

## 2016-10-19 NOTE — ED Triage Notes (Addendum)
Pt  Reports   scough   Congested  And  Sneezing  Perhaps   1   Week       Also  Reports     l  Knee  Pain  Which  Is  Chronic     Pt  Was   At   Er  Last  Pm  Had  X  Trays  But  Left    ama   After

## 2017-10-05 ENCOUNTER — Ambulatory Visit (HOSPITAL_COMMUNITY)
Admission: EM | Admit: 2017-10-05 | Discharge: 2017-10-05 | Disposition: A | Discharge status: Home / Self Care | Payer: Medicaid Other | Attending: Family Medicine | Admitting: Family Medicine

## 2017-10-05 ENCOUNTER — Encounter (HOSPITAL_COMMUNITY): Payer: Self-pay | Admitting: Emergency Medicine

## 2017-10-05 DIAGNOSIS — I10 Essential (primary) hypertension: Secondary | ICD-10-CM

## 2017-10-05 DIAGNOSIS — J111 Influenza due to unidentified influenza virus with other respiratory manifestations: Secondary | ICD-10-CM

## 2017-10-05 DIAGNOSIS — R69 Illness, unspecified: Secondary | ICD-10-CM

## 2017-10-05 MED ORDER — ALBUTEROL SULFATE HFA 108 (90 BASE) MCG/ACT IN AERS: 1.0000 | INHALATION_SPRAY | Freq: Four times a day (QID) | RESPIRATORY_TRACT | 0 refills | Status: AC | PRN

## 2017-10-05 MED ORDER — BENZONATATE 100 MG PO CAPS: 100.0000 mg | ORAL_CAPSULE | Freq: Two times a day (BID) | ORAL | 0 refills | Status: DC | PRN

## 2017-10-05 MED ORDER — IPRATROPIUM BROMIDE 0.03 % NA SOLN: 2.0000 | Freq: Two times a day (BID) | NASAL | 0 refills | Status: DC

## 2017-10-05 MED ORDER — AMLODIPINE BESYLATE 5 MG PO TABS: 5.0000 mg | ORAL_TABLET | Freq: Every day | ORAL | 1 refills | Status: DC

## 2017-10-05 NOTE — ED Triage Notes (Signed)
PT C/O: cold sx associated w/HA, nasal congestion, rattling chest, nasal drainage, watery eyes, dry cough, sneezing, sore throat  Sts she's missed 3 days of work due to illness  BP today = 191/113   ONSET: 6 days  DENIES: fevers  TAKING MEDS: Alka Seltzer   A&O x4... NAD... Ambulatory

## 2017-10-05 NOTE — ED Provider Notes (Signed)
[facility]   [id number] 10/05/17 Arrival Time: 1109   SUBJECTIVE:  Mary Decker is a 41 y.o. female who presents to the urgent care with complaint of cold sx associated w/HA, nasal congestion, rattling chest, nasal drainage, watery eyes, dry cough, sneezing, sore throat  Sts she's missed 3 days of work due to illness  BP today = 191/113       Clear nasal discharge   Past Medical History:  Diagnosis Date  . Hypertension    History reviewed. No pertinent family history. Social History   Socioeconomic History  . Marital status: Single    Spouse name: Not on file  . Number of children: Not on file  . Years of education: Not on file  . Highest education level: Not on file  Social Needs  . Financial resource strain: Not on file  . Food insecurity - worry: Not on file  . Food insecurity - inability: Not on file  . Transportation needs - medical: Not on file  . Transportation needs - non-medical: Not on file  Occupational History  . Not on file  Tobacco Use  . Smoking status: Current Every Day Smoker    Packs/day: 0.50    Types: Cigarettes  . Smokeless tobacco: Never Used  Substance and Sexual Activity  . Alcohol use: Yes  . Drug use: No  . Sexual activity: Not on file  Other Topics Concern  . Not on file  Social History Narrative  . Not on file   No outpatient medications have been marked as taking for the 10/05/17 encounter [facility](Hospital Encounter).   Allergies  Allergen Reactions  . Tomato       ROS: As per HPI, remainder of ROS negative.   OBJECTIVE:   Vitals:   10/05/17 1144  BP: (!) 191/113  Pulse: 82  Resp: 20  Temp: 98 F (36.7 C)  TempSrc: Oral  SpO2: 98%     General appearance: alert; no distress Eyes: PERRL; EOMI; conjunctiva normal HENT: normocephalic; atraumatic; TMs normal, canal normal, external ears normal without trauma; nasal mucosa normal; oral mucosa normal Neck: supple Lungs: clear to auscultation  bilaterally Heart: regular rate and rhythm Back: no CVA tenderness Extremities: no cyanosis or edema; symmetrical with no gross deformities Skin: warm and dry Neurologic: normal gait; grossly normal Psychological: alert and cooperative; normal mood and affect      Labs:  Results for orders placed or performed during the hospital encounter of 04/28/11  I-STAT, chem 8  Result Value Ref Range   Sodium 140 135 - 145 mEq/L   Potassium 3.5 3.5 - 5.1 mEq/L   Chloride 103 96 - 112 mEq/L   BUN 6 6 - 23 mg/dL   Creatinine, Ser 4.090.80 0.50 - 1.10 mg/dL   Glucose, Bld 811100 (H) 70 - 99 mg/dL   Calcium, Ion 9.141.18 7.821.12 - 1.32 mmol/L   TCO2 24 0 - 100 mmol/L   Hemoglobin 16.3 (H) 12.0 - 15.0 g/dL   HCT 95.648.0 (H) 21.336.0 - 08.646.0 %    Labs Reviewed - No data to display  No results found.     ASSESSMENT & PLAN:  1. Influenza-like illness   2. Essential hypertension     Meds ordered this encounter  Medications  . benzonatate (TESSALON) 100 MG capsule    Sig: Take 1 capsule (100 mg total) by mouth 2 (two) times daily as needed for cough.    Dispense:  14 capsule    Refill:  0  .  albuterol (PROVENTIL HFA;VENTOLIN HFA) 108 (90 Base) MCG/ACT inhaler    Sig: Inhale 1-2 puffs into the lungs every 6 (six) hours as needed for wheezing or shortness of breath.    Dispense:  1 Inhaler    Refill:  0  . ipratropium (ATROVENT) 0.03 % nasal spray    Sig: Place 2 sprays into both nostrils 2 (two) times daily.    Dispense:  30 mL    Refill:  0  . amLODipine (NORVASC) 5 MG tablet    Sig: Take 1 tablet (5 mg total) by mouth daily.    Dispense:  30 tablet    Refill:  1    Reviewed expectations re: course of current medical issues. Questions answered. Outlined signs and symptoms indicating need for more acute intervention. Patient verbalized understanding. After Visit Summary given.    Procedures:      Elvina SidleLauenstein, Jabri Blancett, MD 10/05/17 1200

## 2017-10-05 NOTE — Discharge Instructions (Signed)
Your blood pressure is elevated and needs treatment.  Please follow up with Dr. Katrinka BlazingSmith or the primary care provider of your choice within one month

## 2017-11-26 ENCOUNTER — Encounter (HOSPITAL_COMMUNITY): Payer: Self-pay | Admitting: Emergency Medicine

## 2017-11-26 ENCOUNTER — Ambulatory Visit (HOSPITAL_COMMUNITY)
Admission: EM | Admit: 2017-11-26 | Discharge: 2017-11-26 | Disposition: A | Discharge status: Home / Self Care | Payer: Medicaid Other | Attending: Family Medicine | Admitting: Family Medicine

## 2017-11-26 DIAGNOSIS — L600 Ingrowing nail: Secondary | ICD-10-CM

## 2017-11-26 MED ORDER — IBUPROFEN 800 MG PO TABS: 800.0000 mg | ORAL_TABLET | Freq: Three times a day (TID) | ORAL | 0 refills | Status: DC

## 2017-11-26 MED ORDER — AMOXICILLIN 875 MG PO TABS: 875.0000 mg | ORAL_TABLET | Freq: Two times a day (BID) | ORAL | 0 refills | Status: DC

## 2017-11-26 NOTE — ED Triage Notes (Signed)
PT C/O: ingrown toe nail on left greater toe onset 1.5 weeks  DENIES: inj/trauma   TAKING MEDS: none   A&O x4... NAD... Ambulatory

## 2017-11-26 NOTE — ED Provider Notes (Addendum)
  [facility]   [id number] 11/26/17 Arrival Time: 1511  ASSESSMENT & PLAN:  1. Ingrown toenail     Meds ordered this encounter  Medications  . amoxicillin (AMOXIL) 875 MG tablet    Sig: Take 1 tablet (875 mg total) by mouth 2 (two) times daily.    Dispense:  20 tablet    Refill:  0    Order Specific Question:   Supervising Provider    Answer:   Mardella LaymanHAGLER, BRIAN [id number]]  . ibuprofen (ADVIL,MOTRIN) 800 MG tablet    Sig: Take 1 tablet (800 mg total) by mouth 3 (three) times daily.    Dispense:  21 tablet    Refill:  0    Order Specific Question:   Supervising Provider    Answer:   Mardella LaymanHAGLER, BRIAN [[id number]]    Reviewed expectations re: course of current medical issues. Questions answered. Outlined signs and symptoms indicating need for more acute intervention. Patient verbalized understanding. After Visit Summary given.   SUBJECTIVE: History from: patient. Mary Decker is a 42 y.o. female who presents with complaint of intermittent left first toe pain. Reports abrupt onset yesterday. Described symptoms have stabilized since beginning.  ROS: As per HPI.   OBJECTIVE:  Vitals:   11/26/17 1559  BP: (S) (!) 186/113  Pulse: 92  Resp: 16  Temp: 98.5 F (36.9 C)  TempSrc: Oral  SpO2: 99%    General appearance: alert; no distress Eyes: PERRLA; EOMI; conjunctiva normal HENT: normocephalic; atraumatic; TMs normal; nasal mucosa normal; oral mucosa normal Neck: supple  Lungs: clear to auscultation bilaterally Heart: regular rate and rhythm Abdomen: soft, non-tender; bowel sounds normal; no masses or organomegaly; no guarding or rebound tenderness Back: no CVA tenderness Extremities: Left great toenail is tender but no ingrown toenail or cellulitis or discharge seen Skin: warm and dry Neurologic: normal gait; normal symmetric reflexes Psychological: alert and cooperative; normal mood and affect  Labs:  Labs Reviewed - No data to display  Imaging: No results  found.  Allergies  Allergen Reactions  . Tomato     Past Medical History:  Diagnosis Date  . Hypertension    Social History   Socioeconomic History  . Marital status: Single    Spouse name: Not on file  . Number of children: Not on file  . Years of education: Not on file  . Highest education level: Not on file  Social Needs  . Financial resource strain: Not on file  . Food insecurity - worry: Not on file  . Food insecurity - inability: Not on file  . Transportation needs - medical: Not on file  . Transportation needs - non-medical: Not on file  Occupational History  . Not on file  Tobacco Use  . Smoking status: Current Every Day Smoker    Packs/day: 0.50    Types: Cigarettes  . Smokeless tobacco: Never Used  Substance and Sexual Activity  . Alcohol use: Yes  . Drug use: No  . Sexual activity: Not on file  Other Topics Concern  . Not on file  Social History Narrative  . Not on file   History reviewed. No pertinent family history. Past Surgical History:  Procedure Laterality Date  . TUBAL LIGATION       Deatra CanterOxford, Waldemar Siegel J, FNP 11/26/17 1853    Deatra Canterxford, Kylah Maresh J, FNP 11/26/17 [phone]

## 2018-06-25 ENCOUNTER — Other Ambulatory Visit: Payer: Self-pay

## 2018-06-25 ENCOUNTER — Emergency Department (HOSPITAL_COMMUNITY): Payer: Medicaid Other

## 2018-06-25 ENCOUNTER — Emergency Department (HOSPITAL_COMMUNITY)
Admission: EM | Admit: 2018-06-25 | Discharge: 2018-06-25 | Disposition: A | Discharge status: Home / Self Care | Payer: Medicaid Other | Attending: Emergency Medicine | Admitting: Emergency Medicine

## 2018-06-25 ENCOUNTER — Encounter (HOSPITAL_COMMUNITY): Payer: Self-pay | Admitting: Emergency Medicine

## 2018-06-25 DIAGNOSIS — J069 Acute upper respiratory infection, unspecified: Secondary | ICD-10-CM | POA: Diagnosis not present

## 2018-06-25 DIAGNOSIS — Z79899 Other long term (current) drug therapy: Secondary | ICD-10-CM | POA: Insufficient documentation

## 2018-06-25 DIAGNOSIS — F17210 Nicotine dependence, cigarettes, uncomplicated: Secondary | ICD-10-CM | POA: Diagnosis not present

## 2018-06-25 DIAGNOSIS — R0981 Nasal congestion: Secondary | ICD-10-CM | POA: Diagnosis present

## 2018-06-25 DIAGNOSIS — B9789 Other viral agents as the cause of diseases classified elsewhere: Secondary | ICD-10-CM

## 2018-06-25 DIAGNOSIS — I10 Essential (primary) hypertension: Secondary | ICD-10-CM | POA: Insufficient documentation

## 2018-06-25 DIAGNOSIS — I159 Secondary hypertension, unspecified: Secondary | ICD-10-CM

## 2018-06-25 HISTORY — DX: Chronic sinusitis, unspecified: J32.9

## 2018-06-25 HISTORY — DX: Other seasonal allergic rhinitis: J30.2

## 2018-06-25 HISTORY — DX: Bronchitis, not specified as acute or chronic: J40

## 2018-06-25 MED ORDER — BENZONATATE 100 MG PO CAPS: 100.0000 mg | ORAL_CAPSULE | Freq: Three times a day (TID) | ORAL | 0 refills | Status: DC

## 2018-06-25 MED ORDER — AMLODIPINE BESYLATE 5 MG PO TABS: 5.0000 mg | ORAL_TABLET | Freq: Every day | ORAL | 0 refills | Status: DC

## 2018-06-25 MED ORDER — ALBUTEROL SULFATE HFA 108 (90 BASE) MCG/ACT IN AERS
2.0000 | INHALATION_SPRAY | Freq: Once | RESPIRATORY_TRACT | Status: AC
  Administered 2018-06-25: 2 via RESPIRATORY_TRACT
  Filled 2018-06-25: qty 6.7

## 2018-06-25 MED ORDER — OXYMETAZOLINE HCL 0.05 % NA SOLN
1.0000 | Freq: Once | NASAL | Status: AC
  Administered 2018-06-25: 1 via NASAL
  Filled 2018-06-25: qty 15

## 2018-06-25 MED ORDER — ACETAMINOPHEN 325 MG PO TABS
650.0000 mg | ORAL_TABLET | Freq: Once | ORAL | Status: AC
  Administered 2018-06-25: 650 mg via ORAL
  Filled 2018-06-25: qty 2

## 2018-06-25 MED ORDER — AMLODIPINE BESYLATE 5 MG PO TABS
5.0000 mg | ORAL_TABLET | Freq: Once | ORAL | Status: AC
  Administered 2018-06-25: 5 mg via ORAL
  Filled 2018-06-25: qty 1

## 2018-06-25 NOTE — ED Triage Notes (Addendum)
C/o productive cough with thick white phlegm, nasal congestion, facial pressure, and sore throat since Friday.  Reports chills that started today.  Taking Mucinex and multi-allergy relief medication since yesterday.  States it feels like her usual sinus infections that she gets yearly.

## 2018-06-25 NOTE — Discharge Instructions (Signed)
Continue Tylenol or Motrin or over-the-counter cold and flu medications for your symptoms.  Drink plenty of fluids.  Take Tessalon as prescribed for cough.  Use inhaler 2 puffs every 4 hours.  You can also try saline intranasally every 1-2 hours for congestion relief.  Take Afrin twice a day but do not use for more than 3 days.  Please follow-up with your family doctor.  Return if worsening symptoms

## 2018-06-25 NOTE — ED Provider Notes (Signed)
[facility] EMERGENCY DEPARTMENT Provider Note   CSN: [id number] Arrival date & time: 06/25/18  4098     History   Chief Complaint Chief Complaint  Patient presents with  . Cough  . Nasal Congestion    HPI Mary Decker is a 42 y.o. female.  HPI Mary Decker is a 42 y.o. female presents to emergency department with complaint of nasal congestion, cough, shortness of breath, facial pain.  Patient states her symptoms started 2 days ago.  She states it feels like a sinus infection that she has had in the past.  She denies any fever.  Admits to chills and sweats.  She has tried taking Alka-Seltzer cold plus and allergy medicine over-the-counter with no relief of her symptoms.  She reports headache.  Nausea, vomiting, diarrhea.  She states she is feeling short of breath and having productive cough with thick colored sputum.  Denies any hemoptysis.  No recent sick contacts.  She is a smoker.  No other complaints.  Past Medical History:  Diagnosis Date  . Bronchitis   . Hypertension   . Seasonal allergies   . Sinus infection     There are no active problems to display for this patient.   Past Surgical History:  Procedure Laterality Date  . TUBAL LIGATION       OB History   None      Home Medications    Prior to Admission medications   Medication Sig Start Date End Date Taking? Authorizing Provider  acetaminophen (TYLENOL) 500 MG tablet Take 1,000 mg by mouth every 6 (six) hours as needed for moderate pain.    [provider]  albuterol (PROVENTIL HFA;VENTOLIN HFA) 108 (90 Base) MCG/ACT inhaler Inhale 1-2 puffs into the lungs every 6 (six) hours as needed for wheezing or shortness of breath. 10/05/17   Elvina Sidle, MD  amLODipine (NORVASC) 5 MG tablet Take 1 tablet (5 mg total) by mouth daily. 10/05/17   Elvina Sidle, MD  amoxicillin (AMOXIL) 875 MG tablet Take 1 tablet (875 mg total) by mouth 2 (two) times daily. 11/26/17   Deatra Canter, FNP  benzonatate (TESSALON) 100 MG capsule Take 1 capsule (100 mg total) by mouth 2 (two) times daily as needed for cough. 10/05/17   Elvina Sidle, MD  ibuprofen (ADVIL,MOTRIN) 800 MG tablet Take 1 tablet (800 mg total) by mouth 3 (three) times daily. 11/26/17   Deatra Canter, FNP  ipratropium (ATROVENT) 0.03 % nasal spray Place 2 sprays into both nostrils 2 (two) times daily. 10/05/17   Elvina Sidle, MD    Family History No family history on file.  Social History Social History   Tobacco Use  . Smoking status: Current Every Day Smoker    Packs/day: 0.50    Types: Cigarettes  . Smokeless tobacco: Never Used  Substance Use Topics  . Alcohol use: Yes  . Drug use: No     Allergies   Tomato   Review of Systems Review of Systems  Constitutional: Positive for chills. Negative for fever.  HENT: Positive for congestion, rhinorrhea, sinus pressure and sinus pain. Negative for ear pain, facial swelling, sore throat and trouble swallowing.   Respiratory: Positive for cough and shortness of breath. Negative for chest tightness.   Cardiovascular: Negative for chest pain, palpitations and leg swelling.  Gastrointestinal: Negative for abdominal pain, diarrhea, nausea and vomiting.  Musculoskeletal: Negative for arthralgias, myalgias, neck pain and neck stiffness.  Skin: Negative for rash.  Neurological: Positive for headaches. Negative for dizziness and weakness.  All other systems reviewed and are negative.    Physical Exam Updated Vital Signs BP (!) 193/106 (BP Location: Right Arm)   Pulse 78   Temp 98.6 F (37 C) (Oral)   Resp 12   Ht 5' 7.5" (1.715 m)   Wt 72.6 kg   LMP 05/24/2018   SpO2 100%   BMI 24.69 kg/m   Physical Exam  Constitutional: She is oriented to person, place, and time. She appears well-developed and well-nourished. No distress.  HENT:  Head: Normocephalic and atraumatic.  Right Ear: Tympanic membrane, external ear and ear canal  normal.  Left Ear: Tympanic membrane, external ear and ear canal normal.  Nose: Mucosal edema and rhinorrhea present.  Mouth/Throat: Uvula is midline and mucous membranes are normal. Posterior oropharyngeal erythema present. No oropharyngeal exudate, posterior oropharyngeal edema or tonsillar abscesses.  Eyes: Conjunctivae are normal.  Neck: Neck supple.  Cardiovascular: Normal rate, regular rhythm, normal heart sounds and intact distal pulses.  Pulmonary/Chest: Effort normal and breath sounds normal. No respiratory distress. She has no wheezes. She has no rales.  Abdominal: She exhibits no distension.  Musculoskeletal: Normal range of motion.  Neurological: She is alert and oriented to person, place, and time.  Skin: Skin is warm and dry.  Psychiatric: She has a normal mood and affect.  Nursing note and vitals reviewed.    ED Treatments / Results  Labs (all labs ordered are listed, but only abnormal results are displayed) Labs Reviewed - No data to display  EKG None  Radiology No results found.  Procedures Procedures (including critical care time)  Medications Ordered in ED Medications  amLODipine (NORVASC) tablet 5 mg (has no administration in time range)  acetaminophen (TYLENOL) tablet 650 mg (has no administration in time range)  oxymetazoline (AFRIN) 0.05 % nasal spray 1 spray (has no administration in time range)     Initial Impression / Assessment and Plan / ED Course  I have reviewed the triage vital signs and the nursing notes.  Pertinent labs & imaging results that were available during my care of the patient were reviewed by me and considered in my medical decision making (see chart for details).     Patient with 2 days of nasal congestion, cough, sore throat, chills.  Her vital signs are unremarkable other than hypertension, patient is not taking her blood pressure medications.  She states she ran out a few days ago.  Blood pressure is also probably elevated  because of her illness as well as taking over-the-counter decongestants.  She is afebrile here, otherwise nontoxic-appearing.  She has no signs of endorgan damage given her high blood pressure.  Chest x-ray was obtained due to shortness of breath and cough and is negative.  Her lungs are clear on exam.  She states that her main issue is that she cannot breathe through her nose due to congestion, I have given her Afrin to take but instructed not to use it for more than 3 days.  I think that her symptoms are viral, she may have influenza.  We will treat her symptomatically.  She will take Afrin, intranasal saline, Tylenol Motrin or over-the-counter cold and flu medications as well as I will send her home with an inhaler and Tessalon for cough.  Patient agreed to the plan.  She will follow-up with her doctor or come back if she is not improving.  We will also give her a refill on her  blood pressure medication.  Vitals:   06/25/18 1853 06/25/18 1855 06/25/18 2023  BP: (!) 214/118 (!) 193/106 (!) 179/109  Pulse: 78    Resp: 12    Temp: 98.6 F (37 C)    TempSrc: Oral    SpO2: 100%    Weight:  72.6 kg   Height:  5' 7.5" (1.715 m)      Final Clinical Impressions(s) / ED Diagnoses   Final diagnoses:  Secondary hypertension  Viral URI with cough    ED Discharge Orders    None       Jaynie Crumble, Cordelia Poche 06/25/18 2129    Maia Plan, MD 06/26/18 1028

## 2018-08-15 IMAGING — CR DG CHEST 2V
2 series · 2 of 2 positions shown · non-contrast
Comparison: Report of a chest x-ray December 19, 2001.

CLINICAL DATA: Cough, chest congestion, fever, and nausea and
vomiting for the past week. Current smoker.

EXAM:
CHEST  2 VIEW

[chest pa]
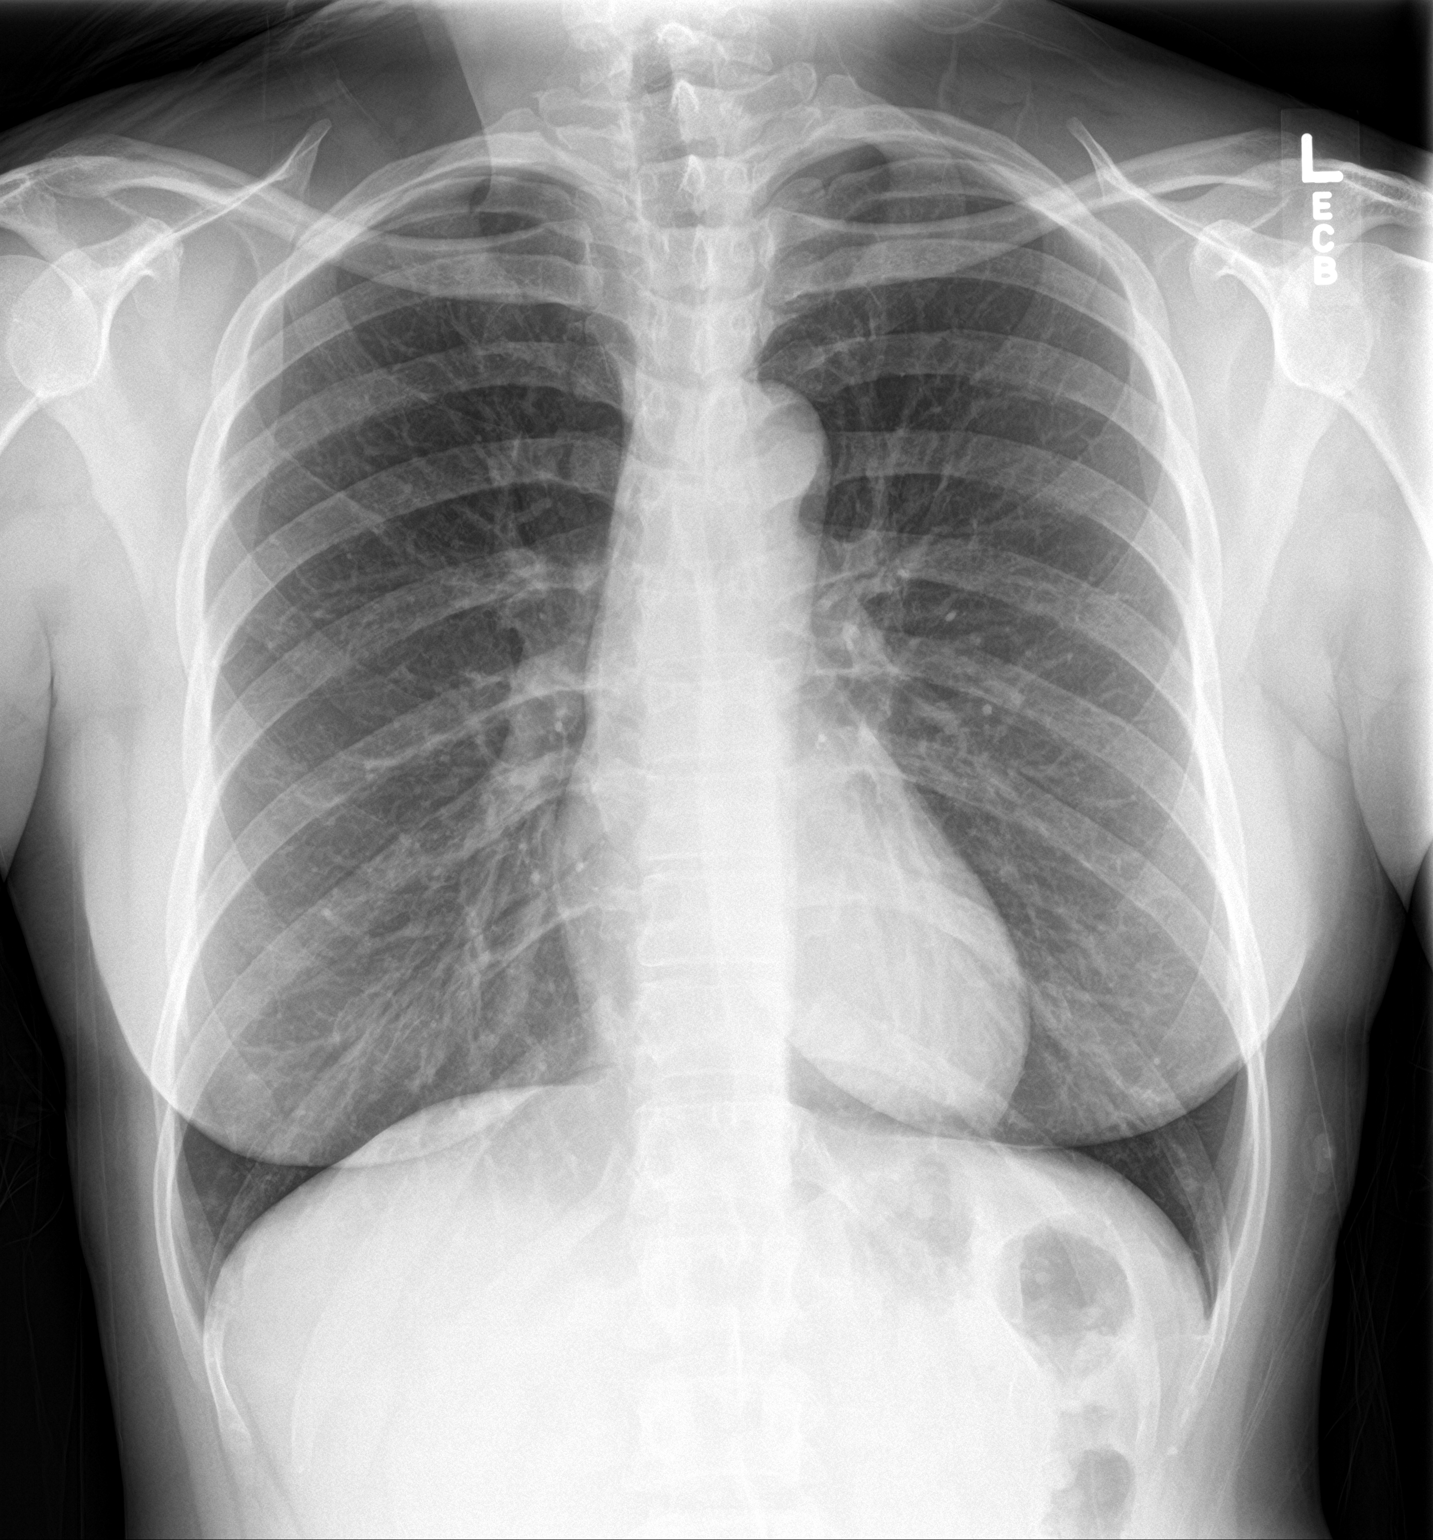

[chest lat]
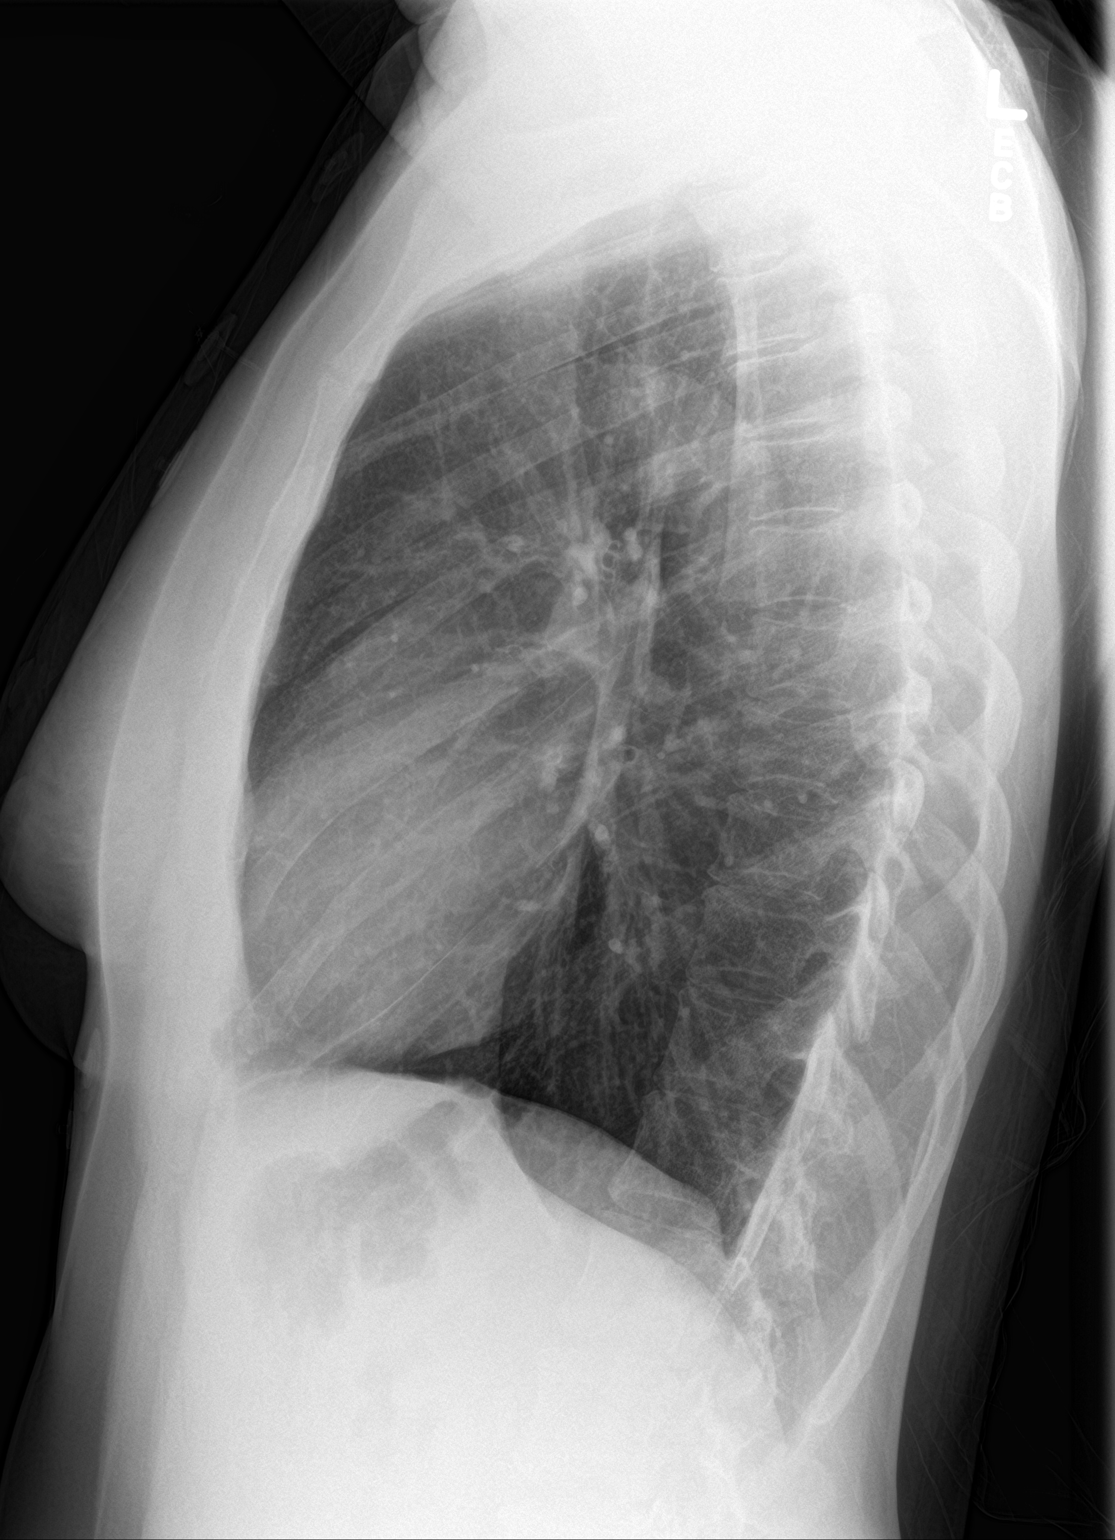

[2 of 2 positions shown; findings below may reference images not displayed]

FINDINGS: The lungs are mildly hyperinflated with hemidiaphragm flattening.
There is no focal infiltrate. There is no pleural effusion or
pneumothorax. The heart and pulmonary vascularity are normal. The
mediastinum is normal in width. The bony thorax exhibits no acute
abnormality.
IMPRESSION: There is no pneumonia nor other acute cardiopulmonary abnormality.
Mild hyperinflation may reflect the patient's smoking history.

## 2019-11-16 ENCOUNTER — Ambulatory Visit (INDEPENDENT_AMBULATORY_CARE_PROVIDER_SITE_OTHER): Payer: Medicaid Other

## 2019-11-16 ENCOUNTER — Other Ambulatory Visit: Payer: Self-pay

## 2019-11-16 ENCOUNTER — Encounter (HOSPITAL_COMMUNITY): Payer: Self-pay | Admitting: Emergency Medicine

## 2019-11-16 ENCOUNTER — Ambulatory Visit (HOSPITAL_COMMUNITY)
Admission: EM | Admit: 2019-11-16 | Discharge: 2019-11-16 | Disposition: A | Discharge status: Home / Self Care | Payer: Medicaid Other | Attending: Family Medicine | Admitting: Family Medicine

## 2019-11-16 DIAGNOSIS — S63501A Unspecified sprain of right wrist, initial encounter: Secondary | ICD-10-CM | POA: Diagnosis not present

## 2019-11-16 DIAGNOSIS — M25532 Pain in left wrist: Secondary | ICD-10-CM

## 2019-11-16 DIAGNOSIS — S66911A Strain of unspecified muscle, fascia and tendon at wrist and hand level, right hand, initial encounter: Secondary | ICD-10-CM

## 2019-11-16 DIAGNOSIS — R030 Elevated blood-pressure reading, without diagnosis of hypertension: Secondary | ICD-10-CM

## 2019-11-16 MED ORDER — METHYLPREDNISOLONE 4 MG PO TBPK: ORAL_TABLET | ORAL | 0 refills | Status: DC

## 2019-11-16 NOTE — ED Triage Notes (Signed)
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 States "I think I did something to it". No definitive injury. Soreness and pain, states working yesterday lifting linens made the pain worse.

## 2019-11-16 NOTE — Discharge Instructions (Addendum)
Wear the wrist brace through the weekend Use ice for 20 minutes every couple of hours Take the Medrol pack as directed.  Take all of day 1 today. When you return to work on Monday, try to work wearing your brace to protect you from pain Return as needed See your primary care doctor for your elevated blood pressure

## 2019-11-16 NOTE — ED Provider Notes (Signed)
[place]    CSN: [id number] Arrival date & time: 11/16/19  1150      History   Chief Complaint Chief Complaint  Patient presents with  . Wrist Pain    HPI Mary Decker is a 44 y.o. female.   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  She has to carry linen up and down stairs and heavy bags.  She states that the elevator is broken and usually it is available for lifting heavy bags and moving carts.  Since she has been doing this by hand her wrist has been hurting.  Her right wrist is very painful.  It started on Thursday.  Today at work she felt a "pop" and pain.  She has bruising and redness on the palmar aspect of the wrist.  It severely painful.  She is crying.  No numbness of fingers.  Pain with any gripping or use of hand. Her blood pressure is up.  She has not taken her medication today.  She has no headache, chest pain, palpitations, swelling of ankles  Past Medical History:  Diagnosis Date  . Bronchitis   . Hypertension   . Seasonal allergies   . Sinus infection     There are no problems to display for this patient.   Past Surgical History:  Procedure Laterality Date  . TUBAL LIGATION      OB History   No obstetric history on file.      Home Medications    Prior to Admission medications   Medication Sig Start Date End Date Taking? Authorizing Provider  acetaminophen (TYLENOL) 500 MG tablet Take 1,000 mg by mouth every 6 (six) hours as needed for moderate pain.    [provider]  albuterol (PROVENTIL HFA;VENTOLIN HFA) 108 (90 Base) MCG/ACT inhaler Inhale 1-2 puffs into the lungs every 6 (six) hours as needed for wheezing or shortness of breath. 10/05/17   Robyn Haber, MD  ibuprofen (ADVIL,MOTRIN) 800 MG tablet Take 1 tablet (800 mg total) by mouth 3 (three) times daily. 11/26/17   Lysbeth Penner, FNP  methylPREDNISolone (MEDROL DOSEPAK) 4 MG TBPK tablet TAD 11/16/19   Raylene Everts, MD  amLODipine  (NORVASC) 5 MG tablet Take 1 tablet (5 mg total) by mouth daily. Patient not taking: Reported on 11/16/2019 06/25/18 11/16/19  Jeannett Senior, PA-C  ipratropium (ATROVENT) 0.03 % nasal spray Place 2 sprays into both nostrils 2 (two) times daily. Patient not taking: Reported on 11/16/2019 10/05/17 11/16/19  Robyn Haber, MD    Family History No family history on file.  Social History Social History   Tobacco Use  . Smoking status: Current Every Day Smoker    Packs/day: 0.50    Types: Cigarettes  . Smokeless tobacco: Never Used  Substance Use Topics  . Alcohol use: Yes  . Drug use: No     Allergies   Tomato   Review of Systems Review of Systems  Eyes: Negative for visual disturbance.  Respiratory: Negative for shortness of breath.   Cardiovascular: Negative for chest pain, palpitations and leg swelling.  Gastrointestinal: Negative for vomiting.  Musculoskeletal: Positive for arthralgias.  Neurological: Negative for dizziness and headaches.     Physical Exam Triage Vital Signs ED Triage Vitals  Enc Vitals Group     BP 11/16/19 1231 (!) 186/108     Pulse Rate 11/16/19 1231 (!) 101     Resp 11/16/19 1231 18     Temp 11/16/19 1231  98.6 F (37 C)     Temp src --      SpO2 11/16/19 1231 97 %     Weight --      Height --      Head Circumference --      Peak Flow --      Pain Score 11/16/19 1232 10     Pain Loc --      Pain Edu? --      Excl. in GC? --    No data found.  Updated Vital Signs BP (!) 186/108 Comment: pt states she is out of her BP meds  Pulse (!) 101   Temp 98.6 F (37 C)   Resp 18   LMP 11/13/2019   SpO2 97%      Physical Exam Constitutional:      General: She is not in acute distress.    Appearance: Normal appearance. She is well-developed and normal weight.     Comments: Tearful  HENT:     Head: Normocephalic and atraumatic.     Mouth/Throat:     Comments: Mask in place Eyes:     Conjunctiva/sclera: Conjunctivae normal.      Pupils: Pupils are equal, round, and reactive to light.  Cardiovascular:     Rate and Rhythm: Normal rate.  Pulmonary:     Effort: Pulmonary effort is normal. No respiratory distress.  Abdominal:     General: There is no distension.     Palpations: Abdomen is soft.  Musculoskeletal:        General: Normal range of motion.       Hands:     Cervical back: Normal range of motion.     Comments: Right wrist has minimal soft tissue swelling.  Limited range of motion.  Pain with grasp, wrist extension and flexion.  Tenderness extends into the forearm up to the medial epicondyle.  Skin:    General: Skin is warm and dry.  Neurological:     General: No focal deficit present.     Mental Status: She is alert.     Sensory: No sensory deficit.  Psychiatric:        Mood and Affect: Mood normal.        Behavior: Behavior normal.      UC Treatments / Results  Labs (all labs ordered are listed, but only abnormal results are displayed) Labs Reviewed - No data to display  EKG   Radiology DG Wrist Complete Left  Result Date: 11/16/2019 CLINICAL DATA:  Pain with use.  Bruising. EXAM: LEFT WRIST - COMPLETE 3+ VIEW COMPARISON:  No recent. FINDINGS: No acute bony or joint abnormality identified. No evidence of fracture or dislocation. IMPRESSION: No acute abnormality. Electronically Signed   By: Maisie Fus  Register   On: 11/16/2019 13:15   DG Wrist Complete Right  Result Date: 11/16/2019 CLINICAL DATA:  Right wrist swelling after injury. EXAM: RIGHT WRIST - COMPLETE 3+ VIEW COMPARISON:  None. FINDINGS: There is no evidence of fracture or dislocation. There is no evidence of arthropathy or other focal bone abnormality. Soft tissues are unremarkable. IMPRESSION: Negative. Electronically Signed   By: Lupita Raider M.D.   On: 11/16/2019 13:21    Procedures Procedures (including critical care time)  Medications Ordered in UC Medications - No data to display  Initial Impression / Assessment and Plan  / UC Course  I have reviewed the triage vital signs and the nursing notes.  Pertinent labs & imaging results that were available  during my care of the patient were reviewed by me and considered in my medical decision making (see chart for details).     Overuse strain Final Clinical Impressions(s) / UC Diagnoses   Final diagnoses:  Sprain and strain of right wrist  Elevated blood pressure reading     Discharge Instructions     Wear the wrist brace through the weekend Use ice for 20 minutes every couple of hours Take the Medrol pack as directed.  Take all of day 1 today. When you return to work on Monday, try to work wearing your brace to protect you from pain Return as needed See your primary care doctor for your elevated blood pressure    ED Prescriptions    Medication Sig Dispense Auth. Provider   methylPREDNISolone (MEDROL DOSEPAK) 4 MG TBPK tablet TAD 21 tablet Eustace Moore, MD     PDMP not reviewed this encounter.   Eustace Moore, MD 11/16/19 [phone]

## 2020-04-21 IMAGING — CR DG CHEST 2V
2 series · 2 of 2 positions shown · non-contrast
Comparison: None.

CLINICAL DATA: Productive cough 1 day.

EXAM:
CHEST - 2 VIEW

[chest pa]
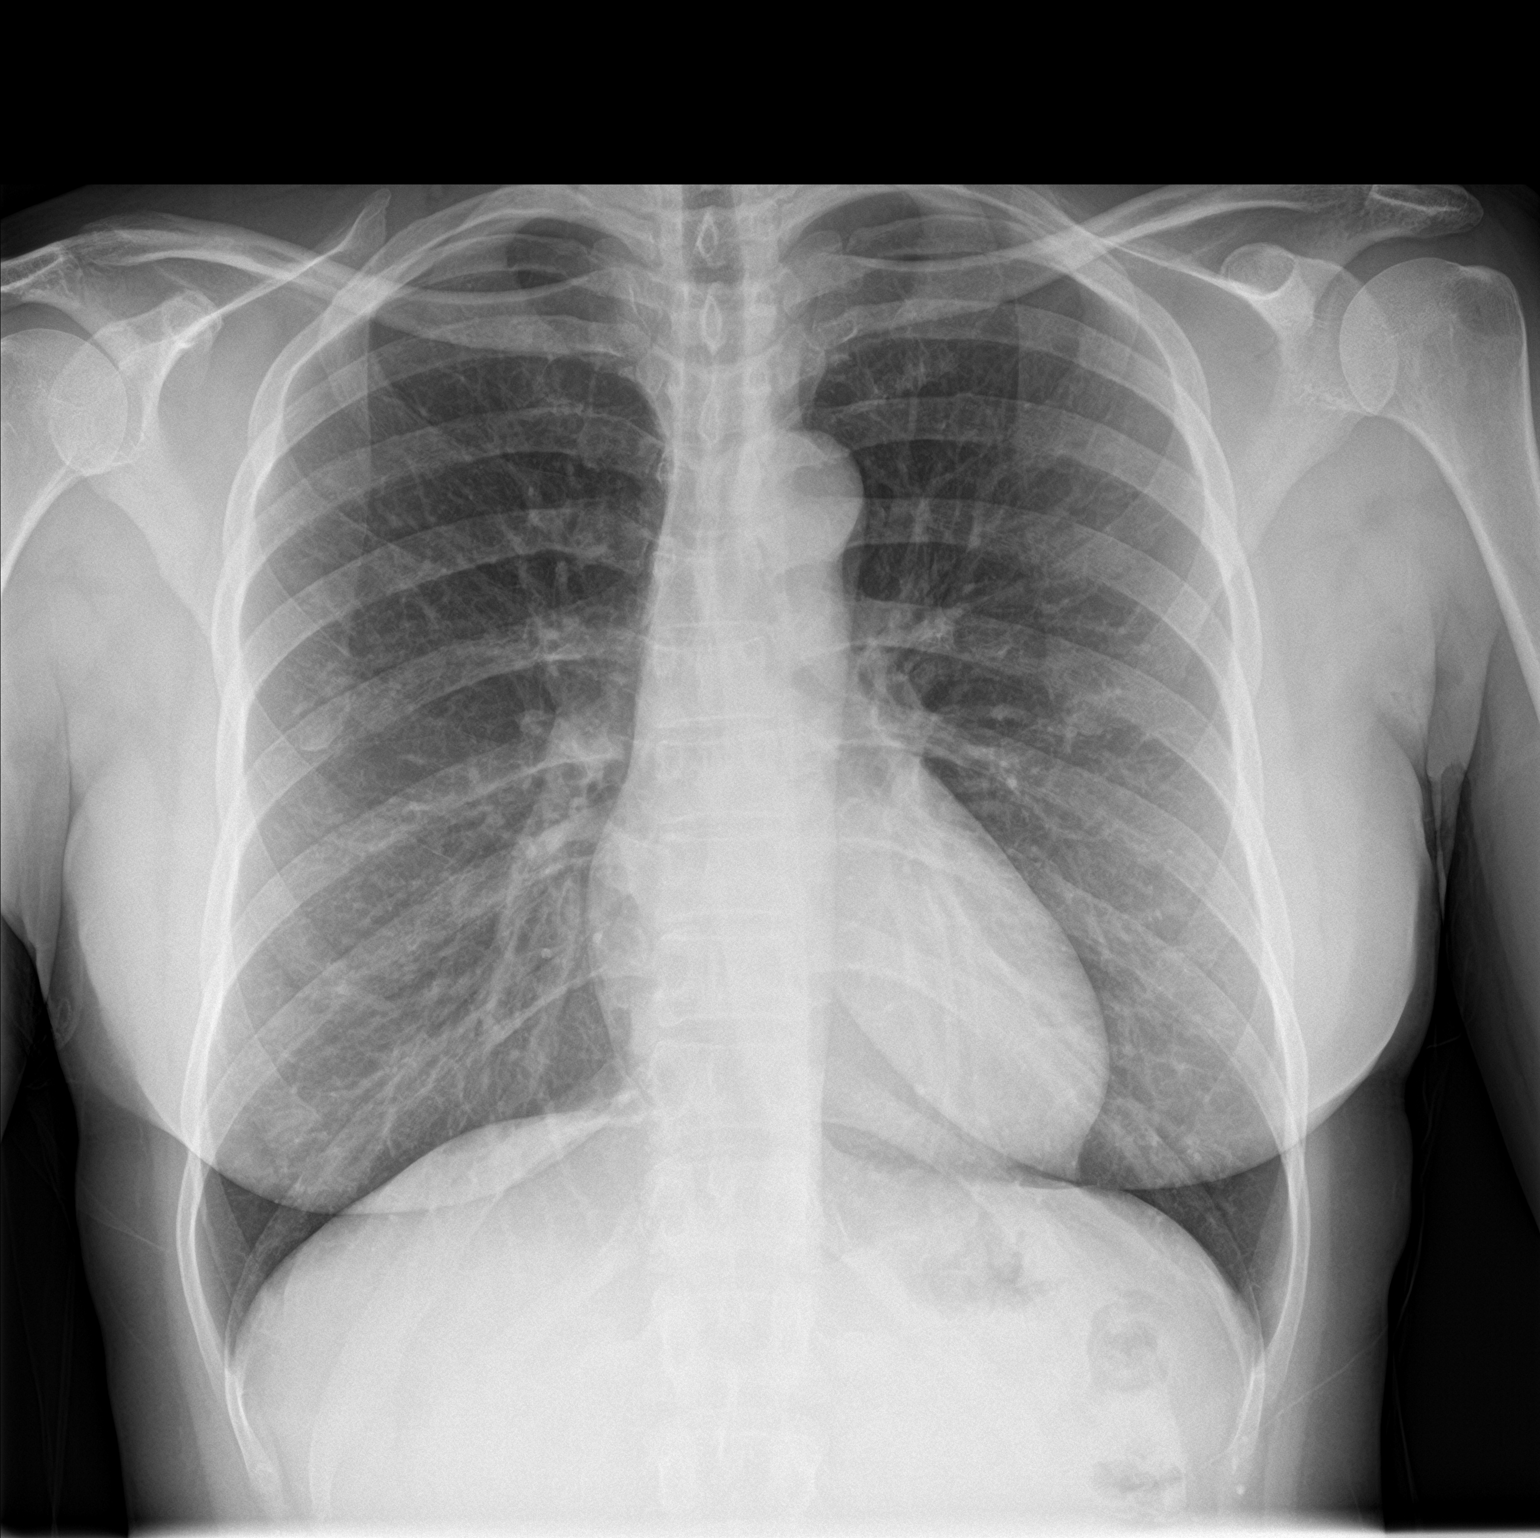

[chest lat]
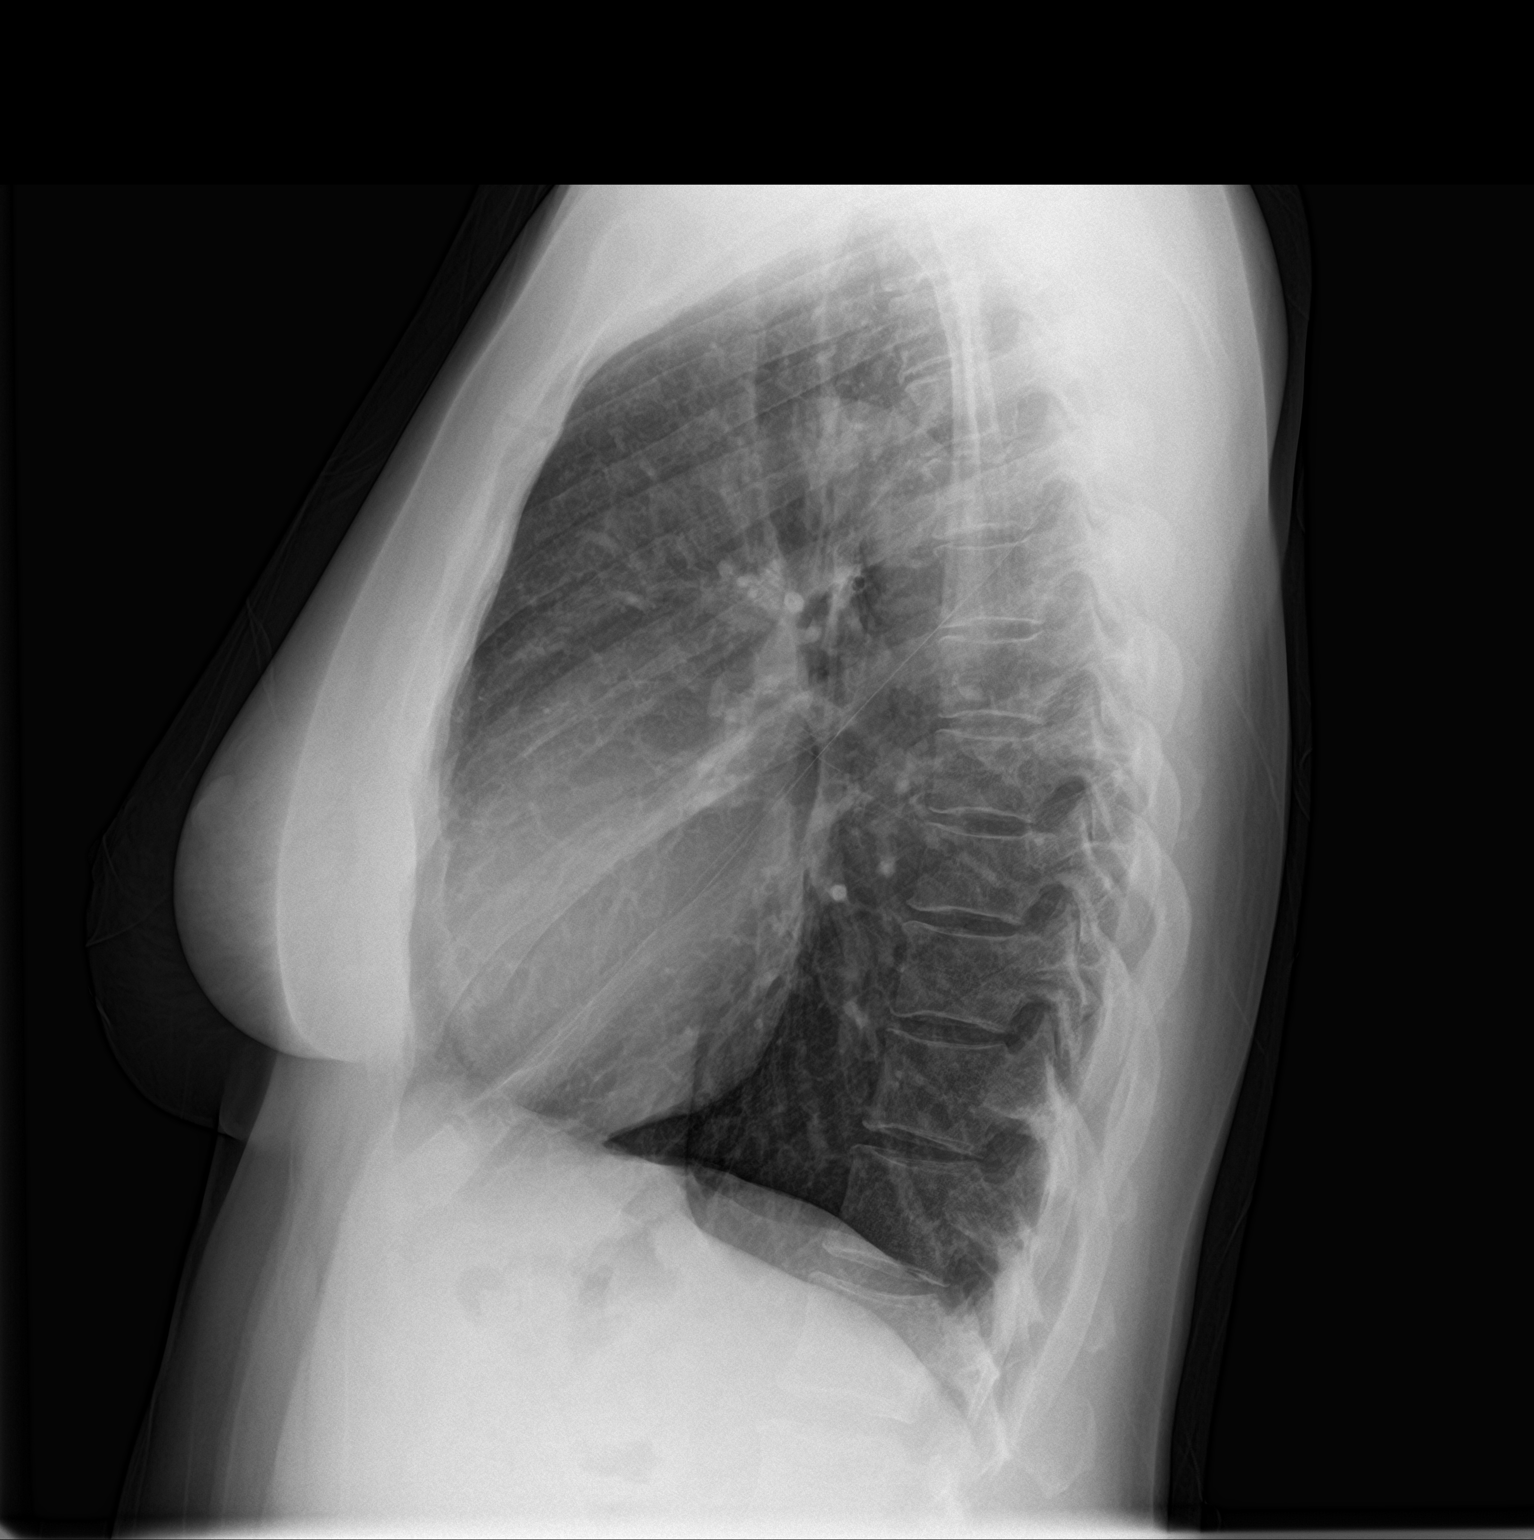

[2 of 2 positions shown; findings below may reference images not displayed]

FINDINGS: Lungs are clear. Cardiomediastinal silhouette and remainder of the
exam is unchanged.
IMPRESSION: No active cardiopulmonary disease.

## 2022-04-29 ENCOUNTER — Ambulatory Visit (HOSPITAL_COMMUNITY)
Admission: EM | Admit: 2022-04-29 | Discharge: 2022-04-29 | Disposition: A | Discharge status: Home / Self Care | Payer: Medicaid Other | Attending: Student | Admitting: Student

## 2022-04-29 ENCOUNTER — Encounter (HOSPITAL_COMMUNITY): Payer: Self-pay

## 2022-04-29 DIAGNOSIS — M654 Radial styloid tenosynovitis [de Quervain]: Secondary | ICD-10-CM

## 2022-04-29 DIAGNOSIS — I160 Hypertensive urgency: Secondary | ICD-10-CM | POA: Diagnosis not present

## 2022-04-29 MED ORDER — AMLODIPINE BESYLATE 5 MG PO TABS: 5.0000 mg | ORAL_TABLET | Freq: Every day | ORAL | 0 refills | Status: DC

## 2022-04-29 NOTE — ED Triage Notes (Signed)
Right wrist pain for 2 weeks. 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 Patient states she will wake up with tingling and numbness in the fingers.  Left wrist pain this past week.   No falls or known injuries.

## 2022-04-29 NOTE — Discharge Instructions (Addendum)
-  Rest, ice, tylenol for pain  -Follow-up with orthopedist or PCP if symptoms persist  -I referred you to a hand specialist. I can't send a 'true' referral at urgent care (I can't help you set up the appointment), so follow-up with PCP if symptoms persist  -Start the amlodipine once daily -Please check your blood pressure at home or at the pharmacy. If this continues to be >140/90, follow-up with your primary care provider for further blood pressure management/ medication titration. If you develop chest pain, shortness of breath, vision changes, the worst headache of your life- head straight to the ED or call 911.

## 2022-04-29 NOTE — ED Provider Notes (Signed)
[facility]    CSN: [id number] Arrival date & time: 04/29/22  0801      History   Chief Complaint Chief Complaint  Patient presents with   Wrist Pain    HPI Mary Decker is a 46 y.o. female presenting with right wrist pain for 2 weeks, left wrist pain for 1 week.  History right wrist tendinitis in 2021, has been using her wrist brace from this visit without relief.  States the pain radiates from the base of her thumb into the medial wrist, worse with movement.  She initially had some tingling in her fingers, but this has actually resolved at the time of this visit.  She works as a Interior and spatial designer, and also works at [employer] which involves a lot of repetitive movements.  She is currently not closely followed by her primary care provider, and does not take any medications for her blood pressure, but denies headaches, dizziness, chest pain, vision changes.  HPI  Past Medical History:  Diagnosis Date   Bronchitis    Hypertension    Seasonal allergies    Sinus infection     There are no problems to display for this patient.   Past Surgical History:  Procedure Laterality Date   TUBAL LIGATION      OB History   No obstetric history on file.      Home Medications    Prior to Admission medications   Medication Sig Start Date End Date Taking? Authorizing Provider  amLODipine (NORVASC) 5 MG tablet Take 1 tablet (5 mg total) by mouth daily. 04/29/22  Yes Rhys Martini, PA-C  acetaminophen (TYLENOL) 500 MG tablet Take 1,000 mg by mouth every 6 (six) hours as needed for moderate pain.    [provider]  albuterol (PROVENTIL HFA;VENTOLIN HFA) 108 (90 Base) MCG/ACT inhaler Inhale 1-2 puffs into the lungs every 6 (six) hours as needed for wheezing or shortness of breath. 10/05/17   Elvina Sidle, MD  ibuprofen (ADVIL,MOTRIN) 800 MG tablet Take 1 tablet (800 mg total) by mouth 3 (three) times daily. 11/26/17   Deatra Canter, FNP  methylPREDNISolone  (MEDROL DOSEPAK) 4 MG TBPK tablet TAD 11/16/19   Eustace Moore, MD  ipratropium (ATROVENT) 0.03 % nasal spray Place 2 sprays into both nostrils 2 (two) times daily. Patient not taking: Reported on 11/16/2019 10/05/17 11/16/19  Elvina Sidle, MD    Family History History reviewed. No pertinent family history.  Social History Social History   Tobacco Use   Smoking status: Every Day    Packs/day: 0.50    Types: Cigarettes   Smokeless tobacco: Never  Substance Use Topics   Alcohol use: Yes   Drug use: No     Allergies   Tomato   Review of Systems Review of Systems  Musculoskeletal:        Wrist pain   All other systems reviewed and are negative.    Physical Exam Triage Vital Signs ED Triage Vitals  Enc Vitals Group     BP 04/29/22 0815 (!) 190/114     Pulse Rate 04/29/22 0815 (!) 106     Resp 04/29/22 0815 16     Temp 04/29/22 0815 98.3 F (36.8 C)     Temp Source 04/29/22 0815 Oral     SpO2 04/29/22 0815 96 %     Weight 04/29/22 0817 160 lb 0.9 oz (72.6 kg)     Height 04/29/22 0817 5\' 7"  (1.702 m)  Head Circumference --      Peak Flow --      Pain Score 04/29/22 0817 10     Pain Loc --      Pain Edu? --      Excl. in GC? --    No data found.  Updated Vital Signs BP (!) 190/114 (BP Location: Left Arm)   Pulse (!) 106   Temp 98.3 F (36.8 C) (Oral)   Resp 16   Ht 5\' 7"  (1.702 m)   Wt 160 lb 0.9 oz (72.6 kg)   LMP 12/23/2021 (Within Weeks)   SpO2 96%   BMI 25.07 kg/m   Visual Acuity Right Eye Distance:   Left Eye Distance:   Bilateral Distance:    Right Eye Near:   Left Eye Near:    Bilateral Near:     Physical Exam Vitals reviewed.  Constitutional:      General: She is not in acute distress.    Appearance: Normal appearance. She is not ill-appearing.  HENT:     Head: Normocephalic and atraumatic.  Pulmonary:     Effort: Pulmonary effort is normal.  Musculoskeletal:     Comments: Bilateral wrists-no skin changes or swelling.   There is tenderness along the medial aspect of the wrists, but no bony tenderness.  Positive Finkelstein bilaterally.  Negative Phalen sign.  Sensation intact.  Grip strength 4/5 bilaterally, radial pulse 2+, cap refil <2 seconds.   Neurological:     General: No focal deficit present.     Mental Status: She is alert and oriented to person, place, and time.  Psychiatric:        Mood and Affect: Mood normal.        Behavior: Behavior normal.        Thought Content: Thought content normal.        Judgment: Judgment normal.      UC Treatments / Results  Labs (all labs ordered are listed, but only abnormal results are displayed) Labs Reviewed - No data to display  EKG   Radiology No results found.  Procedures Procedures (including critical care time)  Medications Ordered in UC Medications - No data to display  Initial Impression / Assessment and Plan / UC Course  I have reviewed the triage vital signs and the nursing notes.  Pertinent labs & imaging results that were available during my care of the patient were reviewed by me and considered in my medical decision making (see chart for details).     This patient is a very pleasant 46 y.o. year old female presenting with bilateral deQuervain's tenosynovitis.  Neurovascularly intact.  No acute trauma, but endorses overuse from both of her jobs.  Provided her with wrist braces for both wrists at patient request.  Unfortunately, her blood pressure is very high today at 190/114, so I do not recommend NSAIDs or prednisone.  We will start her on low-dose amlodipine, follow-up with 49 or PCP.  I attempted to send a referral to hand, but she understands that this is not a true referral as we are in the urgent care setting, and if she has trouble establishing with them she should follow-up with her primary care doctor.  Provided her a work note.   Final Clinical Impressions(s) / UC Diagnoses   Final diagnoses:  Tenosynovitis, de Quervain   Hypertensive urgency     Discharge Instructions      -Rest, ice, tylenol for pain  -Follow-up with orthopedist or PCP if symptoms  persist  -I referred you to a hand specialist. I can't send a 'true' referral at urgent care (I can't help you set up the appointment), so follow-up with PCP if symptoms persist  -Start the amlodipine once daily -Please check your blood pressure at home or at the pharmacy. If this continues to be >140/90, follow-up with your primary care provider for further blood pressure management/ medication titration. If you develop chest pain, shortness of breath, vision changes, the worst headache of your life- head straight to the ED or call 911.    ED Prescriptions     Medication Sig Dispense Auth. Provider   amLODipine (NORVASC) 5 MG tablet Take 1 tablet (5 mg total) by mouth daily. 30 tablet Hazel Sams, PA-C      PDMP not reviewed this encounter.   Hazel Sams, PA-C 04/29/22 [phone]

## 2023-06-03 ENCOUNTER — Encounter (HOSPITAL_COMMUNITY): Payer: Self-pay

## 2023-06-03 ENCOUNTER — Ambulatory Visit (HOSPITAL_COMMUNITY)
Admission: EM | Admit: 2023-06-03 | Discharge: 2023-06-03 | Disposition: A | Discharge status: Home / Self Care | Payer: Medicaid Other | Attending: Physician Assistant | Admitting: Physician Assistant

## 2023-06-03 DIAGNOSIS — I10 Essential (primary) hypertension: Secondary | ICD-10-CM

## 2023-06-03 DIAGNOSIS — M7918 Myalgia, other site: Secondary | ICD-10-CM | POA: Diagnosis not present

## 2023-06-03 DIAGNOSIS — M7711 Lateral epicondylitis, right elbow: Secondary | ICD-10-CM

## 2023-06-03 MED ORDER — METHOCARBAMOL 500 MG PO TABS: 500.0000 mg | ORAL_TABLET | Freq: Two times a day (BID) | ORAL | 0 refills | Status: AC

## 2023-06-03 MED ORDER — PREDNISONE 20 MG PO TABS: 40.0000 mg | ORAL_TABLET | Freq: Every day | ORAL | 0 refills | Status: AC

## 2023-06-03 MED ORDER — AMLODIPINE BESYLATE 10 MG PO TABS: 10.0000 mg | ORAL_TABLET | Freq: Every day | ORAL | 2 refills | Status: AC

## 2023-06-03 NOTE — ED Provider Notes (Signed)
[facility]    CSN: [id number] Arrival date & time: 06/03/23  1446      History   Chief Complaint Chief Complaint  Patient presents with   Elbow Pain    HPI Mary Decker is a 47 y.o. female.   Patient presents today with a 1 week history of worsening right elbow pain.  She denies any known injury or increase in activity prior to symptom onset.  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  Pain is rated 10 on a 0-10 pain scale, localized to lateral elbow with radiation into her forearm, described as intense aching with periodic sharp pains, no alleviating factors identified.  She does report increased numbness in her right hand that has been present for a while but has worsened.  She denies previous injury or surgery involving her elbow or hand.  She does have a history of carpal tunnel and tendinitis and uses braces to help manage the symptoms but current symptoms are not similar to previous episodes of this condition.  She has taken ibuprofen without improvement.  She has no concern for pregnancy.  Her blood pressure is very elevated today.  She denies any current headache, chest pain, shortness of breath, vision change, dizziness.  She has been without her medication for many months.  She is not currently followed by primary care.  She has been taking NSAIDs for pain.  Denies any use of decongestants, caffeine, sodium.  She is not monitoring her blood pressure at home.  She is open to restarting medication and establishing with a new PCP.    Past Medical History:  Diagnosis Date   Bronchitis    Hypertension    Seasonal allergies    Sinus infection     There are no problems to display for this patient.   Past Surgical History:  Procedure Laterality Date   TUBAL LIGATION      OB History   No obstetric history on file.      Home Medications    Prior to Admission medications   Medication Sig Start Date End Date  Taking? Authorizing Provider  methocarbamol (ROBAXIN) 500 MG tablet Take 1 tablet (500 mg total) by mouth 2 (two) times daily. 06/03/23  Yes Jasper Ruminski K, PA-C  predniSONE (DELTASONE) 20 MG tablet Take 2 tablets (40 mg total) by mouth daily for 4 days. 06/03/23 06/07/23 Yes Loomis Anacker, Noberto Retort, PA-C  acetaminophen (TYLENOL) 500 MG tablet Take 1,000 mg by mouth every 6 (six) hours as needed for moderate pain.    [provider]  albuterol (PROVENTIL HFA;VENTOLIN HFA) 108 (90 Base) MCG/ACT inhaler Inhale 1-2 puffs into the lungs every 6 (six) hours as needed for wheezing or shortness of breath. 10/05/17   Elvina Sidle, MD  amLODipine (NORVASC) 10 MG tablet Take 1 tablet (10 mg total) by mouth daily. 06/03/23   Hezekiah Veltre K, PA-C  ipratropium (ATROVENT) 0.03 % nasal spray Place 2 sprays into both nostrils 2 (two) times daily. Patient not taking: Reported on 11/16/2019 10/05/17 11/16/19  Elvina Sidle, MD    Family History History reviewed. No pertinent family history.  Social History Social History   Tobacco Use   Smoking status: Every Day    Current packs/day: 0.50    Types: Cigarettes   Smokeless tobacco: Never  Substance Use Topics   Alcohol use: Yes   Drug use: No     Allergies   Tomato  Review of Systems Review of Systems  Constitutional:  Positive for activity change. Negative for appetite change, fatigue and fever.  Eyes:  Negative for visual disturbance.  Respiratory:  Negative for shortness of breath.   Cardiovascular:  Negative for chest pain, palpitations and leg swelling.  Gastrointestinal:  Negative for abdominal pain, diarrhea, nausea and vomiting.  Musculoskeletal:  Positive for arthralgias. Negative for myalgias.  Neurological:  Positive for numbness (Right hand). Negative for dizziness, weakness, light-headedness and headaches.     Physical Exam Triage Vital Signs ED Triage Vitals  Encounter Vitals Group     BP 06/03/23 1654 (!) 223/122      Systolic BP Percentile --      Diastolic BP Percentile --      Pulse Rate 06/03/23 1654 84     Resp 06/03/23 1654 18     Temp 06/03/23 1654 97.9 F (36.6 C)     Temp Source 06/03/23 1654 Oral     SpO2 06/03/23 1654 97 %     Weight --      Height --      Head Circumference --      Peak Flow --      Pain Score 06/03/23 1655 10     Pain Loc --      Pain Education --      Exclude from Growth Chart --    No data found.  Updated Vital Signs BP (!) 217/113 (BP Location: Left Arm)   Pulse 84   Temp 97.9 F (36.6 C) (Oral)   Resp 18   SpO2 97%   Visual Acuity Right Eye Distance:   Left Eye Distance:   Bilateral Distance:    Right Eye Near:   Left Eye Near:    Bilateral Near:     Physical Exam Vitals reviewed.  Constitutional:      General: She is awake. She is not in acute distress.    Appearance: Normal appearance. She is well-developed. She is not ill-appearing.     Comments: Very pleasant female appears stated age in no acute distress sitting comfortably in exam room  HENT:     Head: Normocephalic and atraumatic.  Cardiovascular:     Rate and Rhythm: Normal rate and regular rhythm.     Heart sounds: Normal heart sounds, S1 normal and S2 normal. No murmur heard. Pulmonary:     Effort: Pulmonary effort is normal.     Breath sounds: Normal breath sounds. No wheezing, rhonchi or rales.     Comments: Clear to auscultation bilaterally Abdominal:     Palpations: Abdomen is soft.     Tenderness: There is no abdominal tenderness.  Musculoskeletal:     Right elbow: Decreased range of motion. Tenderness present in lateral epicondyle.     Right lower leg: No edema.     Left lower leg: No edema.     Comments: Right elbow/arm: Tenderness over lateral epicondylitis and along brachioradialis muscle belly.  No deformity noted.  Decreased range of motion secondary to pain particularly with extension.  Normal capillary refill right hand.  Decree sensation based on to point  discrimination.  Normal pincer grip strength.  Psychiatric:        Behavior: Behavior is cooperative.      UC Treatments / Results  Labs (all labs ordered are listed, but only abnormal results are displayed) Labs Reviewed - No data to display  EKG   Radiology No results found.  Procedures Procedures (including critical care time)  Medications Ordered in UC Medications - No data to display  Initial Impression / Assessment and Plan / UC Course  I have reviewed the triage vital signs and the nursing notes.  Pertinent labs & imaging results that were available during my care of the patient were reviewed by me and considered in my medical decision making (see chart for details).     Blood pressure is very elevated today but patient denies any signs/symptoms of endorgan damage.  Refill of amlodipine was sent to pharmacy at 10 mg increased from previously prescribed 5 mg.  Discussed she will likely need a second agent but will defer initiation of this for the time being as she restarts this medicine.  She is to avoid decongestants, caffeine, sodium, NSAIDs.  Recommend she monitor her blood pressure at home and keep a log for evaluation of follow-up appointment.  She is to follow-up with primary care; she does not currently have a PCP so we will try to establish her with someone via PCP assistance.  If she is unable to be seen within the next few weeks she is to return here for medication adjustment.  Discussed that if she develops any chest pain, shortness of breath, headache, vision change, dizziness in setting of high blood pressure she needs to be seen immediately.  No indication for plain film as patient denies any recent trauma and has no focal bony tenderness.  Concern for lateral epicondylitis given clinical presentation.  Will treat with prednisone burst of 40 mg for 4 days.  She was instructed not to take NSAIDs with this medication.  Recommend she wear strenuous activity including  heavy lifting or repetitive movements.  She can use Tylenol for breakthrough pain.  Given tenderness over brachial radialis she was also started on muscle relaxer in the hopes of additional pain relief.  Discussed that this can be sedating and she is not to drive or drink alcohol with taking it.  Given this is her dominant hand and she has associated numbness I did recommend she follow-up with orthopedics as soon as possible.  She was given contact information for local provider with instruction to call to schedule an appointment soon as possible.  If she has any worsening or changing symptoms she needs to be seen immediately.  Strict return precautions given.  Work excuse note provided.  Final Clinical Impressions(s) / UC Diagnoses   Final diagnoses:  Lateral epicondylitis of right elbow  Brachioradialis muscle tenderness  Elevated blood pressure reading with diagnosis of hypertension     Discharge Instructions      Your blood pressure is very elevated.  Please start amlodipine daily.  Avoid decongestants, caffeine, sodium, NSAIDs (aspirin, ibuprofen/Advil, naproxen/Aleve).  Follow-up with primary care; someone should call to schedule an appointment.  If you are unable to see them within a few weeks please return here.  If you develop any chest pain, shortness of breath, headache, vision change, dizziness in the setting of high blood pressure you need to go to the emergency room.  Take prednisone as prescribed.  Do not take NSAIDs with this medication.  You can take Tylenol for breakthrough pain.  Take methocarbamol up to twice a day.  This will make you sleepy so do not drive drink alcohol with taking it.  Given your pain and the numbness in your hand I do want you to follow-up with orthopedic provider soon as possible.  Call them to schedule an appointment.  If anything worsens you need to be seen  immediately.     ED Prescriptions     Medication Sig Dispense Auth. Provider   amLODipine  (NORVASC) 10 MG tablet Take 1 tablet (10 mg total) by mouth daily. 30 tablet Jacobey Gura K, PA-C   predniSONE (DELTASONE) 20 MG tablet Take 2 tablets (40 mg total) by mouth daily for 4 days. 8 tablet Estefan Pattison K, PA-C   methocarbamol (ROBAXIN) 500 MG tablet Take 1 tablet (500 mg total) by mouth 2 (two) times daily. 20 tablet Saraiya Kozma, Noberto Retort, PA-C      PDMP not reviewed this encounter.   Jeani Hawking, PA-C 06/03/23 1733

## 2023-06-03 NOTE — Discharge Instructions (Addendum)
Your blood pressure is very elevated.  Please start amlodipine daily.  Avoid decongestants, caffeine, sodium, NSAIDs (aspirin, ibuprofen/Advil, naproxen/Aleve).  Follow-up with primary care; someone should call to schedule an appointment.  If you are unable to see them within a few weeks please return here.  If you develop any chest pain, shortness of breath, headache, vision change, dizziness in the setting of high blood pressure you need to go to the emergency room.  Take prednisone as prescribed.  Do not take NSAIDs with this medication.  You can take Tylenol for breakthrough pain.  Take methocarbamol up to twice a day.  This will make you sleepy so do not drive drink alcohol with taking it.  Given your pain and the numbness in your hand I do want you to follow-up with orthopedic provider soon as possible.  Call them to schedule an appointment.  If anything worsens you need to be seen immediately.

## 2023-06-03 NOTE — ED Triage Notes (Signed)
Pt c/o rt elbow pain radiating down to rt wrist x1wk. States having numbness to fingers. Denies injury. States took ibuprofen and rubbed with oils with no relief.

## 2023-06-10 ENCOUNTER — Encounter (HOSPITAL_COMMUNITY): Payer: Self-pay

## 2024-06-25 ENCOUNTER — Other Ambulatory Visit: Payer: Self-pay | Admitting: Medical Genetics
